# Patient Record
Sex: Male | Born: 1965 | Race: White | Hispanic: No | Marital: Married | Smoking: Never smoker
Health system: Southern US, Community
[De-identification: ages and names within clinical notes are randomized; demographics above are authoritative.]

## PROBLEM LIST (undated history)

## (undated) DIAGNOSIS — Z87442 Personal history of urinary calculi: Secondary | ICD-10-CM

## (undated) DIAGNOSIS — Z9109 Other allergy status, other than to drugs and biological substances: Secondary | ICD-10-CM

## (undated) DIAGNOSIS — F411 Generalized anxiety disorder: Secondary | ICD-10-CM

## (undated) DIAGNOSIS — Z973 Presence of spectacles and contact lenses: Secondary | ICD-10-CM

## (undated) DIAGNOSIS — M1990 Unspecified osteoarthritis, unspecified site: Secondary | ICD-10-CM

## (undated) HISTORY — PX: WISDOM TOOTH EXTRACTION: SHX21

## (undated) HISTORY — DX: Other allergy status, other than to drugs and biological substances: Z91.09

## (undated) HISTORY — DX: Generalized anxiety disorder: F41.1

## (undated) HISTORY — DX: Personal history of urinary calculi: Z87.442

---

## 2004-05-14 ENCOUNTER — Ambulatory Visit: Payer: Self-pay | Admitting: Internal Medicine

## 2004-09-28 ENCOUNTER — Ambulatory Visit: Payer: Self-pay | Admitting: Internal Medicine

## 2004-09-30 ENCOUNTER — Ambulatory Visit: Payer: Self-pay | Admitting: Family Medicine

## 2005-04-14 ENCOUNTER — Ambulatory Visit: Payer: Self-pay | Admitting: Internal Medicine

## 2005-05-26 ENCOUNTER — Ambulatory Visit: Payer: Self-pay | Admitting: Family Medicine

## 2005-07-27 ENCOUNTER — Ambulatory Visit: Payer: Self-pay | Admitting: Family Medicine

## 2006-05-17 ENCOUNTER — Ambulatory Visit: Payer: Self-pay | Admitting: Family Medicine

## 2006-11-22 ENCOUNTER — Ambulatory Visit: Payer: Self-pay | Admitting: Internal Medicine

## 2006-11-22 DIAGNOSIS — J019 Acute sinusitis, unspecified: Secondary | ICD-10-CM

## 2006-11-22 DIAGNOSIS — J309 Allergic rhinitis, unspecified: Secondary | ICD-10-CM | POA: Insufficient documentation

## 2006-11-22 DIAGNOSIS — Z87442 Personal history of urinary calculi: Secondary | ICD-10-CM

## 2008-04-15 ENCOUNTER — Ambulatory Visit: Payer: Self-pay | Admitting: Family Medicine

## 2009-11-10 ENCOUNTER — Ambulatory Visit: Payer: Self-pay | Admitting: Family Medicine

## 2009-11-10 DIAGNOSIS — L255 Unspecified contact dermatitis due to plants, except food: Secondary | ICD-10-CM

## 2010-01-29 ENCOUNTER — Encounter (INDEPENDENT_AMBULATORY_CARE_PROVIDER_SITE_OTHER): Payer: Self-pay | Admitting: *Deleted

## 2010-07-28 NOTE — Letter (Signed)
Summary: [name] letter  [facility]  [address] [place], [place] [address]   Phone: [phone]  Fax: [phone]       01/29/2010 MRN: [id number]  Peter Sanford [address] [place], [place]  [address]  Dear Mr. [facility] [place] and [place] announce the retirement of Arta Silence, M.D., from full-time practice at the [facility] office effective December 25, 2009 and his plans of returning part-time.  It is important to Dr. Hetty Ely and to our practice that you understand that [facility] has seven physicians in our office for your health care needs.  We will continue to offer the same exceptional care that you have today.    Dr. Hetty Ely has spoken to many of you about his plans for retirement and returning part-time in the fall.   We will continue to work with you through the transition to schedule appointments for you in the office and meet the high standards that [facility] is committed to.   Again, it is with great pleasure that we share the news that Dr. Hetty Ely will return to [facility] in October of 2011 with a reduced schedule.    If you have any questions, or would like to request an appointment with one of our physicians, please call us at [phone] and press the option for Scheduling an appointment.  We take pleasure in providing you with excellent patient care and look forward to seeing you at your next office visit.  Our [facility] Physicians are:  Tillman Abide, M.D. Laurita Quint, M.D. Roxy Manns, M.D. Kerby Nora, M.D. Hannah Beat, M.D. Ruthe Mannan, M.D. We proudly welcomed Raechel Ache, M.D. and Eustaquio Boyden, M.D. to the practice in July/August 2011.  Sincerely,  [facility]

## 2010-07-28 NOTE — Assessment & Plan Note (Signed)
Summary: Peter Sanford  CYD   Vital Signs:  Patient profile:   45 year old male Height:      68 inches Weight:      172 pounds BMI:     26.25 Temp:     98.6 degrees F oral Pulse rate:   68 / minute Pulse rhythm:   regular BP sitting:   110 / 70  (left arm) Cuff size:   regular  Vitals Entered By: Sydell Axon LPN (Nov 10, 2009 12:37 PM) CC: Peter ivy for a week   History of Present Illness: Pt here for Peter ivy for the past 6-7 days that has gotten progressively worse. It has started on the legs, lower posterior left then anterior and then bilat, now on the arms as well and red and now swelling. It has been weeping for a few days as well. He has had it before but typically not this bad. He has been applying topical OTC products.  Problems Prior to Update: 1)  Sinusitis, Acute  (ICD-461.9) 2)  Nephrolithiasis, Hx of  (ICD-V13.01) 3)  Allergic Rhinitis  (ICD-477.9)  Medications Prior to Update: 1)  Claritin 10 Mg Tabs (Loratadine) .Marland Kitchen.. 1 Daily As Needed 2)  Amoxicillin 500 Mg Caps (Amoxicillin) .... 2 Tabs By Mouth Bid  Allergies: No Known Drug Allergies  Physical Exam  General:  Well-developed,well-nourished,in no acute distress; alert,appropriate and cooperative throughout examination, comfortable and nonacute. Head:  Normocephalic and atraumatic without obvious abnormalities. No apparent alopecia but early  balding. Sinuses nontender. Eyes:  Conjunctiva clear bilaterally. No involvement of the eyes.  Skin:  Vessicular erythem based coalescent rash in some areas with weeping, esp behind the knees and post calf, more linear on the right LE, arms and erythem faint coalescent rash behind the left ear.   Impression & Recommendations:  Problem # 1:  RHUS DERMATITIS (ICD-692.6) Assessment New Take Prednisone and apply Clobetasol. Call if continues. His updated medication list for this problem includes:    Claritin 10 Mg Tabs (Loratadine) .Marland Kitchen... 1 daily as needed  Prednisone 50 Mg Tabs (Prednisone) ..... One tab by mouth in am    Clobetasol Propionate 0.05 % Crea (Clobetasol propionate) .Marland Kitchen... Apply to area two times a day  Complete Medication List: 1)  Claritin 10 Mg Tabs (Loratadine) .Marland Kitchen.. 1 daily as needed 2)  Prednisone 50 Mg Tabs (Prednisone) .... One tab by mouth in am 3)  Clobetasol Propionate 0.05 % Crea (Clobetasol propionate) .... Apply to area two times a day  Patient Instructions: 1)  RTC as needed. Prescriptions: CLOBETASOL PROPIONATE 0.05 % CREA (CLOBETASOL PROPIONATE) apply to area two times a day  #60  gms x 0   Entered and Authorized by:   Shaune Leeks MD   Signed by:   Shaune Leeks MD on 11/10/2009   Method used:   Electronically to        [facility] (retail)       [address]       [place], [place]  [address]       Ph: [phone]       Fax: [phone]   RxID:   [id number] PREDNISONE 50 MG TABS (PREDNISONE) one tab by mouth in AM  #6 x 0   Entered and Authorized by:   Shaune Leeks MD   Signed by:   Shaune Leeks MD on 11/10/2009   Method used:   Electronically to  [facility]       [address]       [place], [place]  [address]       Ph: [phone]       Fax: [phone]   RxID:   [id number]   Current Allergies (reviewed today): No known allergies

## 2011-02-02 ENCOUNTER — Ambulatory Visit (INDEPENDENT_AMBULATORY_CARE_PROVIDER_SITE_OTHER): Payer: BC Managed Care – PPO | Admitting: Family Medicine

## 2011-02-02 ENCOUNTER — Encounter: Payer: Self-pay | Admitting: Family Medicine

## 2011-02-02 VITALS — BP 130/80 | HR 73 | Temp 98.2°F | Ht 69.0 in | Wt 166.0 lb

## 2011-02-02 DIAGNOSIS — F411 Generalized anxiety disorder: Secondary | ICD-10-CM

## 2011-02-02 MED ORDER — DIAZEPAM 5 MG PO TABS: 5.0000 mg | ORAL_TABLET | Freq: Three times a day (TID) | ORAL | Status: DC | PRN

## 2011-02-02 MED ORDER — CITALOPRAM HYDROBROMIDE 20 MG PO TABS: 20.0000 mg | ORAL_TABLET | Freq: Every day | ORAL | Status: DC

## 2011-02-02 NOTE — Patient Instructions (Signed)
F/u 4-6 weeks

## 2011-02-02 NOTE — Progress Notes (Signed)
  Subjective:    Patient ID: Peter Sanford, male    DOB: 02-18-1966, 45 y.o.   MRN: [id number]  HPI  Head does not really feel right. Having a lot of nervousness. A great deal of stress at work. About two weeks ago it would happen, triggered a cyclical thought process. Has been going on and consuming for about two. Feels like it has been ongoing for a couple of weeks. Had a similar to a fwar response.   Getting overly worried about things. Not sleeping well. Some stress with finances and money.   No substance.  Jumps rope 20 minutes a day  The PMH, PSH, Social History, Family History, Medications, and allergies have been reviewed in [facility], and have been updated if relevant.  Review of Systems ROS above, no fever, chills, sweats or systemic sx    Objective:   Physical Exam   Physical Exam  Blood pressure 130/80, pulse 73, temperature 98.2 F (36.8 C), temperature source Oral, height 5\' 9"  (1.753 m), weight 166 lb (75.297 kg), SpO2 98.00%.  GEN: WDWN, NAD, Non-toxic, A & O x 3 HEENT: Atraumatic, Normocephalic. Neck supple. No masses, No LAD. Ears and Nose: No external deformity. EXTR: No c/c/e NEURO Normal gait.  PSYCH: Normally interactive. Conversant. Not depressed or anxious appearing.  Calm demeanor.        Assessment & Plan:   1. Generalized anxiety disorder  citalopram (CELEXA) 20 MG tablet, diazepam (VALIUM) 5 MG tablet   Anxiety, start meds, working out already Benadryl prn for sleep Valium prn if needed for now, short term

## 2011-02-04 ENCOUNTER — Telehealth: Payer: Self-pay | Admitting: *Deleted

## 2011-02-04 MED ORDER — ZOLPIDEM TARTRATE 10 MG PO TABS: 10.0000 mg | ORAL_TABLET | Freq: Every evening | ORAL | Status: DC | PRN

## 2011-02-04 NOTE — Telephone Encounter (Signed)
[facility] and medication fixed in epic

## 2011-02-04 NOTE — Telephone Encounter (Signed)
Pt is asking for something to help him sleep.   Says he discussed this with you at his visit.   He says the valium you gave him isn't working.  [facility]

## 2011-02-04 NOTE — Telephone Encounter (Signed)
Please call in   Ambien 10 mg, 1 po qhs prn sleep. #30, 0 refills

## 2011-02-05 ENCOUNTER — Telehealth: Payer: Self-pay | Admitting: *Deleted

## 2011-02-05 ENCOUNTER — Encounter: Payer: Self-pay | Admitting: Family Medicine

## 2011-02-05 NOTE — Telephone Encounter (Signed)
Rx called to pharmacy and patient advised  

## 2011-02-05 NOTE — Telephone Encounter (Signed)
He can try Lunesta. There is literally going to be nothing else aside from this and all the other things I have mentioned.  Call in Lunesta 3 mg. 1 tab po 10 minutes before bed. #30, 0 refills.  I also would suggest getting over the counter Melatonin. Take 3-5 mg 1 hour before sleep every night. This is very safe and can be done indefinitely.

## 2011-02-05 NOTE — Telephone Encounter (Signed)
Patient said that he has tried the benadryl and it didn't work for patient.

## 2011-02-05 NOTE — Telephone Encounter (Signed)
No, try Benadryl 2 tablets (50 mg) before bed.  Or Unisom (doxylamine) - 1 tablet before bed.  Not both Take about 30 mins to an hour before sleep.  Make sure he understands name and instructions.

## 2011-02-05 NOTE — Telephone Encounter (Signed)
Pt states he tried [place] last night and it didn't work, says he only slept about 3 hours, woke up and he was wide awake.  He's asking what he can try next, ambien CR?  [facility] [place].

## 2011-02-08 ENCOUNTER — Telehealth: Payer: Self-pay | Admitting: *Deleted

## 2011-02-08 NOTE — Telephone Encounter (Signed)
Form faxed to [facility].

## 2011-02-08 NOTE — Telephone Encounter (Signed)
Opened in error

## 2011-02-08 NOTE — Telephone Encounter (Signed)
done

## 2011-02-08 NOTE — Telephone Encounter (Signed)
Prior auth is needed for lunesta, form is on your desk. 

## 2011-02-09 NOTE — Telephone Encounter (Signed)
Prior auth given for [employer], advised pharmacy, approval letter placed on doctor's desk for signature and scanning.

## 2011-04-28 ENCOUNTER — Other Ambulatory Visit: Payer: Self-pay | Admitting: Family Medicine

## 2011-08-19 ENCOUNTER — Other Ambulatory Visit: Payer: Self-pay | Admitting: Family Medicine

## 2011-10-18 ENCOUNTER — Other Ambulatory Visit: Payer: Self-pay | Admitting: Family Medicine

## 2011-11-17 ENCOUNTER — Other Ambulatory Visit: Payer: Self-pay | Admitting: Family Medicine

## 2012-02-22 ENCOUNTER — Other Ambulatory Visit: Payer: Self-pay | Admitting: Family Medicine

## 2012-02-22 NOTE — Telephone Encounter (Signed)
Received refill request electronically. Last office visit 02/02/11. Is it okay to refill medication?

## 2012-02-23 ENCOUNTER — Telehealth: Payer: Self-pay | Admitting: *Deleted

## 2012-02-23 NOTE — Telephone Encounter (Signed)
Pt has scheduled physical for 9/19.  Do you want him to have lab work prior?

## 2012-02-23 NOTE — Telephone Encounter (Signed)
Ok  To refill 30, 1 ov, please set up Ov CPX

## 2012-02-23 NOTE — Telephone Encounter (Signed)
Yes - set up appt for lab

## 2012-02-23 NOTE — Telephone Encounter (Signed)
Medicine sent to pharmacy.  Spoke with patient and advised that he needs physical before further refills.  We were in the process of finding an available time when his phone cut off, I called him back but had to leave a message asking that he call back to schedule.

## 2012-02-24 NOTE — Telephone Encounter (Signed)
Pt declined lab appt at this time.  He will discuss this when he comes in for his physical.

## 2012-03-16 ENCOUNTER — Encounter: Payer: Self-pay | Admitting: Family Medicine

## 2012-03-16 ENCOUNTER — Ambulatory Visit (INDEPENDENT_AMBULATORY_CARE_PROVIDER_SITE_OTHER): Payer: BC Managed Care – PPO | Admitting: Family Medicine

## 2012-03-16 VITALS — BP 135/82 | HR 62 | Temp 98.9°F | Ht 69.0 in | Wt 187.0 lb

## 2012-03-16 DIAGNOSIS — Z23 Encounter for immunization: Secondary | ICD-10-CM

## 2012-03-16 DIAGNOSIS — Z0000 Encounter for general adult medical examination without abnormal findings: Secondary | ICD-10-CM

## 2012-03-16 DIAGNOSIS — Z131 Encounter for screening for diabetes mellitus: Secondary | ICD-10-CM

## 2012-03-16 DIAGNOSIS — R5381 Other malaise: Secondary | ICD-10-CM

## 2012-03-16 DIAGNOSIS — Z13220 Encounter for screening for lipoid disorders: Secondary | ICD-10-CM

## 2012-03-16 DIAGNOSIS — L570 Actinic keratosis: Secondary | ICD-10-CM

## 2012-03-16 NOTE — Progress Notes (Signed)
[facility] conservation officer at [facility] [address] [place] [address] Phone: [phone] Fax: [phone]  Date:  03/16/2012   Name:  Peter Sanford   DOB:  August 13, 1965   MRN:  [id number] Gender: male Age: 46 y.o.  PCP:  Hannah Beat, MD    Chief Complaint: Annual Exam   History of Present Illness:  Peter Sanford is a 46 y.o. pleasant patient who presents with the following:  Running 20 miles a week Mood has levelled  Some stress at home and work.  Healthy male for CPX  Preventative Health Maintenance Visit:  Health Maintenance Summary Reviewed and updated, unless pt declines services.  Tobacco History Reviewed. Alcohol: No concerns, no excessive use Exercise Habits: running STD concerns: no risk or activity to increase risk Drug Use: None Encouraged self-testicular check  Health Maintenance  Topic Date Due  . Tetanus/tdap  07/09/1985  . Influenza Vaccine  02/27/2012     Wt Readings from Last 3 Encounters:  03/16/12 187 lb (84.823 kg)  02/02/11 166 lb (75.297 kg)  11/10/09 172 lb (78.019 kg)     Patient Active Problem List  Diagnosis  . ALLERGIC RHINITIS  . RHUS DERMATITIS  . NEPHROLITHIASIS, HX OF  . Generalized anxiety disorder    Past Medical History  Diagnosis Date  . Pollen allergies   . History of nephrolithiasis     No past surgical history on file.  History  Substance Use Topics  . Smoking status: Never Smoker   . Smokeless tobacco: Not on file  . Alcohol Use: Yes    No family history on file.  No Known Allergies  Medication list has been reviewed and updated.  Outpatient Prescriptions Prior to Visit  Medication Sig Dispense Refill  . citalopram (CELEXA) 20 MG tablet TAKE ONE TABLET BY MOUTH ONE TIME DAILY  30 tablet  1  . Eszopiclone (ESZOPICLONE) 3 MG TABS Take 3 mg by mouth at bedtime. Take immediately before bedtime       . Loratadine (CLARITIN) 10 MG CAPS Take 1 capsule by mouth daily as needed.           Review of Systems:   General: Denies fever, chills, sweats. No significant weight loss. Eyes: Denies blurring,significant itching ENT: Denies earache, sore throat, and hoarseness. Cardiovascular: Denies chest pains, palpitations, dyspnea on exertion Respiratory: Denies cough, dyspnea at rest,wheeezing Breast: no concerns about lumps GI: Denies nausea, vomiting, diarrhea, constipation, change in bowel habits, abdominal pain, melena, hematochezia GU: Denies penile discharge, ED, urinary flow / outflow problems. No STD concerns. Musculoskeletal: Denies back pain, joint pain Derm: 2 PLACES ON HEAD, 1 PLACE ON FOREARM, ONE PLACE ON L KNEE  Neuro: Denies  paresthesias, frequent falls, frequent headaches Psych: Denies depression, anxiety Endocrine: Denies cold intolerance, heat intolerance, polydipsia Heme: Denies enlarged lymph nodes Allergy: No hayfever  Physical Examination: Filed Vitals:   03/16/12 1448  BP: 135/82  Pulse: 62  Temp: 98.9 F (37.2 C)   Filed Vitals:   03/16/12 1448  Height: 5\' 9"  (1.753 m)  Weight: 187 lb (84.823 kg)   Body mass index is 27.62 kg/(m^2). Ideal Body Weight: Weight in (lb) to have BMI = 25: 168.9   GEN: well developed, well nourished, no acute distress Eyes: conjunctiva and lids normal, PERRLA, EOMI ENT: TM clear, nares clear, oral exam WNL Neck: supple, no lymphadenopathy, no thyromegaly, no JVD Pulm: clear to auscultation and percussion, respiratory effort normal CV: regular rate and rhythm, S1-S2, no murmur,  rub or gallop, no bruits, peripheral pulses normal and symmetric, no cyanosis, clubbing, edema or varicosities GI: soft, non-tender; no hepatosplenomegaly, masses; active bowel sounds all quadrants GU: no hernia, testicular mass, penile discharge Lymph: no cervical, axillary or inguinal adenopathy MSK: gait normal, muscle tone and strength WNL, no joint swelling, effusions, discoloration, crepitus  SKIN: small scaly lesions,  forehead x 2, l forearm distal to LE, and L medial knee. Neuro: normal mental status, normal strength, sensation, and motion Psych: alert; oriented to person, place and time, normally interactive and not anxious or depressed in appearance.  Assessment and Plan:  1. Routine general medical examination at a health care facility    2. Need for prophylactic vaccination and inoculation against influenza    3. Screening for lipoid disorders  LDL cholesterol, direct  4. Screening for diabetes mellitus  Basic metabolic panel  5. Other malaise and fatigue  CBC with Differential, Hepatic function panel  6. AK (actinic keratosis)     The patient's preventative maintenance and recommended screening tests for an annual wellness exam were reviewed in full today. Brought up to date unless services declined.  Counselled on the importance of diet, exercise, and its role in overall health and mortality. The patient's FH and SH was reviewed, including their home life, tobacco status, and drug and alcohol status.   Cryotherapy  Reason: concern for AK, precancer Location: 2 on scalp, 1 on forearm (left), and 1 on L medial knee  Liquid nitrogen was applied using the liquid nitrogen gun without difficulty with an otoscope tip for concentration. Tolerated well without complications.   Orders Today:  Orders Placed This Encounter  Procedures  . Basic metabolic panel  . CBC with Differential  . Hepatic function panel  . LDL cholesterol, direct    Updated Medication List: (Includes new medications, updates to list, dose adjustments) Outpatient Encounter Prescriptions as of 03/16/2012  Medication Sig Dispense Refill  . citalopram (CELEXA) 20 MG tablet TAKE ONE TABLET BY MOUTH ONE TIME DAILY  30 tablet  1  . Eszopiclone (ESZOPICLONE) 3 MG TABS Take 3 mg by mouth at bedtime. Take immediately before bedtime       . Loratadine (CLARITIN) 10 MG CAPS Take 1 capsule by mouth daily as needed.           Medications Discontinued: There are no discontinued medications.   Hannah Beat, MD

## 2012-03-17 LAB — CBC WITH DIFFERENTIAL/PLATELET
Basophils Relative: 1.5 % (ref 0.0–3.0)
Eosinophils Relative: 6.1 % — ABNORMAL HIGH (ref 0.0–5.0)
HCT: 47.1 % (ref 39.0–52.0)
Hemoglobin: 15.6 g/dL (ref 13.0–17.0)
Lymphs Abs: 1.8 10*3/uL (ref 0.7–4.0)
Monocytes Relative: 9.8 % (ref 3.0–12.0)
Neutro Abs: 2.7 10*3/uL (ref 1.4–7.7)
Platelets: 256 10*3/uL (ref 150.0–400.0)
RBC: 5.01 Mil/uL (ref 4.22–5.81)
WBC: 5.4 10*3/uL (ref 4.5–10.5)

## 2012-03-17 LAB — BASIC METABOLIC PANEL
Chloride: 103 mEq/L (ref 96–112)
GFR: 80.93 mL/min (ref >60.00)
Potassium: 4.2 mEq/L (ref 3.5–5.1)
Sodium: 138 mEq/L (ref 135–145)

## 2012-03-17 LAB — LDL CHOLESTEROL, DIRECT: Direct LDL: 164.2 mg/dL

## 2012-03-17 LAB — HEPATIC FUNCTION PANEL
ALT: 23 U/L (ref 0–53)
AST: 22 U/L (ref 0–37)
Total Bilirubin: 0.9 mg/dL (ref 0.3–1.2)
Total Protein: 7.4 g/dL (ref 6.0–8.3)

## 2012-04-19 ENCOUNTER — Other Ambulatory Visit: Payer: Self-pay | Admitting: Family Medicine

## 2012-10-12 ENCOUNTER — Other Ambulatory Visit: Payer: Self-pay | Admitting: Family Medicine

## 2012-11-23 ENCOUNTER — Other Ambulatory Visit: Payer: Self-pay | Admitting: Family Medicine

## 2012-12-24 ENCOUNTER — Other Ambulatory Visit: Payer: Self-pay | Admitting: Family Medicine

## 2013-01-21 ENCOUNTER — Other Ambulatory Visit: Payer: Self-pay | Admitting: Family Medicine

## 2013-03-21 ENCOUNTER — Other Ambulatory Visit: Payer: Self-pay | Admitting: Family Medicine

## 2013-03-21 NOTE — Telephone Encounter (Signed)
Last office visit 03/16/2012.  Refill?

## 2013-03-22 ENCOUNTER — Other Ambulatory Visit: Payer: Self-pay | Admitting: Family Medicine

## 2013-03-22 NOTE — Telephone Encounter (Signed)
Ok to refill 30, 2 refills  F/u cpx

## 2013-03-22 NOTE — Telephone Encounter (Signed)
Last office visit 03/16/2012.  Ok to refill? 

## 2013-03-23 NOTE — Telephone Encounter (Signed)
Left message on cell phone that refills on his Celexa has been sent to his pharmacy but to call our office next week to schedule a CPE with a pre-physical lab appointment.

## 2013-06-05 ENCOUNTER — Other Ambulatory Visit (INDEPENDENT_AMBULATORY_CARE_PROVIDER_SITE_OTHER): Payer: BC Managed Care – PPO

## 2013-06-05 DIAGNOSIS — Z79899 Other long term (current) drug therapy: Secondary | ICD-10-CM

## 2013-06-05 DIAGNOSIS — Z13220 Encounter for screening for lipoid disorders: Secondary | ICD-10-CM

## 2013-06-05 DIAGNOSIS — R5381 Other malaise: Secondary | ICD-10-CM

## 2013-06-05 LAB — LIPID PANEL
Cholesterol: 221 mg/dL — ABNORMAL HIGH (ref 0–200)
HDL: 39.9 mg/dL (ref >39.00)
Triglycerides: 119 mg/dL (ref 0.0–149.0)
VLDL: 23.8 mg/dL (ref 0.0–40.0)

## 2013-06-05 LAB — CBC WITH DIFFERENTIAL/PLATELET
Basophils Absolute: 0 10*3/uL (ref 0.0–0.1)
Basophils Relative: 0.8 % (ref 0.0–3.0)
Eosinophils Relative: 5.3 % — ABNORMAL HIGH (ref 0.0–5.0)
Hemoglobin: 15.2 g/dL (ref 13.0–17.0)
Lymphocytes Relative: 37 % (ref 12.0–46.0)
Monocytes Absolute: 0.3 10*3/uL (ref 0.1–1.0)
Monocytes Relative: 6 % (ref 3.0–12.0)
Neutro Abs: 2.4 10*3/uL (ref 1.4–7.7)
Neutrophils Relative %: 50.9 % (ref 43.0–77.0)
RBC: 4.84 Mil/uL (ref 4.22–5.81)
RDW: 13.2 % (ref 11.5–14.6)
WBC: 4.6 10*3/uL (ref 4.5–10.5)

## 2013-06-05 LAB — HEPATIC FUNCTION PANEL
Albumin: 4.1 g/dL (ref 3.5–5.2)
Alkaline Phosphatase: 42 U/L (ref 39–117)
Bilirubin, Direct: 0 mg/dL (ref 0.0–0.3)
Total Protein: 7.3 g/dL (ref 6.0–8.3)

## 2013-06-05 LAB — BASIC METABOLIC PANEL
CO2: 24 mEq/L (ref 19–32)
Chloride: 101 mEq/L (ref 96–112)
Creatinine, Ser: 1.3 mg/dL (ref 0.4–1.5)
GFR: 65.23 mL/min (ref >60.00)
Glucose, Bld: 165 mg/dL — ABNORMAL HIGH (ref 70–99)

## 2013-06-06 ENCOUNTER — Ambulatory Visit: Payer: BC Managed Care – PPO

## 2013-06-06 DIAGNOSIS — R7309 Other abnormal glucose: Secondary | ICD-10-CM

## 2013-06-06 LAB — HEMOGLOBIN A1C: Hgb A1c MFr Bld: 6 % (ref 4.6–6.5)

## 2013-06-11 ENCOUNTER — Encounter: Payer: Self-pay | Admitting: Family Medicine

## 2013-06-11 ENCOUNTER — Ambulatory Visit (INDEPENDENT_AMBULATORY_CARE_PROVIDER_SITE_OTHER): Payer: BC Managed Care – PPO | Admitting: Family Medicine

## 2013-06-11 VITALS — BP 114/80 | HR 58 | Temp 98.1°F | Ht 68.0 in | Wt 178.5 lb

## 2013-06-11 DIAGNOSIS — Z23 Encounter for immunization: Secondary | ICD-10-CM

## 2013-06-11 DIAGNOSIS — Z0000 Encounter for general adult medical examination without abnormal findings: Secondary | ICD-10-CM

## 2013-06-11 NOTE — Progress Notes (Signed)
Date:  06/11/2013   Name:  Peter Sanford   DOB:  04/05/1966   MRN:  [id number] Gender: male Age: 47 y.o.  Primary Physician:  Hannah Beat, MD   Chief Complaint: Annual Exam   Subjective:   History of Present Illness:  Peter Sanford is a 47 y.o. pleasant patient who presents with the following:  Preventative Health Maintenance Visit:  Health Maintenance Summary Reviewed and updated, unless pt declines services.  Tobacco History Reviewed. Alcohol: No concerns, no excessive use Exercise Habits: Some activity, rec at least 30 mins 5 times a week STD concerns: no risk or activity to increase risk Drug Use: None Encouraged self-testicular check  Health Maintenance  Topic Date Due  . Influenza Vaccine  01/26/2014  . Tetanus/tdap  06/12/2023    Immunization History  Administered Date(s) Administered  . Influenza Split 03/16/2012  . Tdap 06/11/2013    Patient Active Problem List   Diagnosis Date Noted  . Generalized anxiety disorder 02/02/2011  . 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  . ALLERGIC RHINITIS 11/22/2006  . NEPHROLITHIASIS, HX OF 11/22/2006    Past Medical History  Diagnosis Date  . Pollen allergies   . History of nephrolithiasis     No past surgical history on file.  History   Social History  . Marital Status: Married    Spouse Name: N/A    Number of Children: N/A  . Years of Education: N/A   Occupational History  . Not on file.   Social History Main Topics  . Smoking status: Never Smoker   . Smokeless tobacco: Never Used  . Alcohol Use: Yes  . Drug Use: No  . Sexual Activity: Not on file   Other Topics Concern  . Not on file   Social History Narrative  . No narrative on file    No family history on file.  No Known Allergies  Medication list has been reviewed and updated.  Review of Systems:  General: Denies fever, chills, sweats. No significant weight loss. Eyes: Denies blurring,significant itching ENT: Denies  earache, sore throat, and hoarseness. Cardiovascular: Denies chest pains, palpitations, dyspnea on exertion Respiratory: Denies cough, dyspnea at rest,wheeezing Breast: no concerns about lumps GI: Denies nausea, vomiting, diarrhea, constipation, change in bowel habits, abdominal pain, melena, hematochezia GU: Denies penile discharge, ED, urinary flow / outflow problems. No STD concerns. Musculoskeletal: Denies back pain, joint pain Derm: Denies rash, itching Neuro: Denies  paresthesias, frequent falls, frequent headaches Psych: Denies depression, anxiety Endocrine: Denies cold intolerance, heat intolerance, polydipsia Heme: Denies enlarged lymph nodes Allergy: No hayfever  Objective:   Physical Examination: BP 114/80  Pulse 58  Temp(Src) 98.1 F (36.7 C) (Oral)  Ht 5\' 8"  (1.727 m)  Wt 178 lb 8 oz (80.967 kg)  BMI 27.15 kg/m2 Ideal Body Weight: Weight in (lb) to have BMI = 25: 164.1  GEN: well developed, well nourished, no acute distress Eyes: conjunctiva and lids normal, PERRLA, EOMI ENT: TM clear, nares clear, oral exam WNL Neck: supple, no lymphadenopathy, no thyromegaly, no JVD Pulm: clear to auscultation and percussion, respiratory effort normal CV: regular rate and rhythm, S1-S2, no murmur, rub or gallop, no bruits, peripheral pulses normal and symmetric, no cyanosis, clubbing, edema or varicosities GI: soft, non-tender; no hepatosplenomegaly, masses; active bowel sounds all quadrants GU: no hernia, testicular mass, penile discharge Lymph: no cervical, axillary or inguinal adenopathy MSK: gait normal, muscle tone and strength WNL, no joint swelling, effusions, discoloration, crepitus  SKIN: clear, good turgor, color WNL,  no rashes, lesions, or ulcerations Neuro: normal mental status, normal strength, sensation, and motion Psych: alert; oriented to person, place and time, normally interactive and not anxious or depressed in appearance.  All labs reviewed with  patient.  Lipids:    Component Value Date/Time   CHOL 221* 06/05/2013 1102   TRIG 119.0 06/05/2013 1102   HDL 39.90 06/05/2013 1102   LDLDIRECT 168.9 06/05/2013 1102   VLDL 23.8 06/05/2013 1102   CHOLHDL 6 06/05/2013 1102    CBC:    Component Value Date/Time   WBC 4.6 06/05/2013 1102   HGB 15.2 06/05/2013 1102   HCT 44.8 06/05/2013 1102   PLT 244.0 06/05/2013 1102   MCV 92.6 06/05/2013 1102   NEUTROABS 2.4 06/05/2013 1102   LYMPHSABS 1.7 06/05/2013 1102   MONOABS 0.3 06/05/2013 1102   EOSABS 0.2 06/05/2013 1102   BASOSABS 0.0 06/05/2013 1102    Basic Metabolic Panel:    Component Value Date/Time   NA 133* 06/05/2013 1102   K 4.0 06/05/2013 1102   CL 101 06/05/2013 1102   CO2 24 06/05/2013 1102   BUN 16 06/05/2013 1102   CREATININE 1.3 06/05/2013 1102   GLUCOSE 165* 06/05/2013 1102   CALCIUM 8.9 06/05/2013 1102    Lab Results  Component Value Date   ALT 23 06/05/2013   AST 23 06/05/2013   ALKPHOS 42 06/05/2013   BILITOT 0.8 06/05/2013   Lab Results  Component Value Date   HGBA1C 6.0 06/06/2013     Assessment & Plan:   Health Maintenance Exam: The patient's preventative maintenance and recommended screening tests for an annual wellness exam were reviewed in full today. Brought up to date unless services declined.  Counselled on the importance of diet, exercise, and its role in overall health and mortality. The patient's FH and SH was reviewed, including their home life, tobacco status, and drug and alcohol status.  Routine general medical examination at a health care facility  Need for prophylactic vaccination with combined diphtheria-tetanus-pertussis (DTP) vaccine - Plan: Tdap vaccine greater than or equal to 7yo IM   Doing well. Was not fasting and blood draw and BS at 160, but a1c is 6  Orders Today:  Orders Placed This Encounter  Procedures  . Tdap vaccine greater than or equal to 7yo IM    New medications, updates to list, dose adjustments: No orders of the defined  types were placed in this encounter.    Signed,  Elpidio Galea. Quirino Kakos, MD, CAQ Sports Medicine  [facility] [address] [place] [address] Phone: [phone] Fax: [phone]  Updated Complete Medication List:   Medication List       This list is accurate as of: 06/11/13  5:01 PM.  Always use your most recent med list.               citalopram 20 MG tablet  Commonly known as:  CELEXA  TAKE ONE TABLET BY MOUTH ONE TIME DAILY     CLARITIN 10 MG Caps  Generic drug:  Loratadine  Take 1 capsule by mouth daily as needed.

## 2013-06-11 NOTE — Progress Notes (Signed)
Pre-visit discussion using our clinic review tool. No additional management support is needed unless otherwise documented below in the visit note.  

## 2013-07-18 ENCOUNTER — Other Ambulatory Visit: Payer: Self-pay | Admitting: Family Medicine

## 2013-09-14 ENCOUNTER — Other Ambulatory Visit: Payer: Self-pay | Admitting: Family Medicine

## 2014-09-18 ENCOUNTER — Other Ambulatory Visit: Payer: Self-pay | Admitting: Family Medicine

## 2014-09-23 ENCOUNTER — Other Ambulatory Visit: Payer: Self-pay | Admitting: Family Medicine

## 2014-09-23 NOTE — Telephone Encounter (Signed)
Please schedule CPE with fasting lab prior for Dr. Lorelei Pont.

## 2014-09-23 NOTE — Telephone Encounter (Signed)
Last office visit 06/11/2013.  No future appointments scheduled.  Refill?

## 2014-09-23 NOTE — Telephone Encounter (Signed)
Ok to refill 90 days and f/u cpx

## 2014-09-24 NOTE — Telephone Encounter (Signed)
Left message asking pt to call office  °

## 2014-12-17 ENCOUNTER — Encounter: Payer: Self-pay | Admitting: Emergency Medicine

## 2014-12-17 ENCOUNTER — Emergency Department
Admission: EM | Admit: 2014-12-17 | Discharge: 2014-12-18 | Disposition: A | Discharge status: Home / Self Care | Payer: BC Managed Care – PPO | Attending: Emergency Medicine | Admitting: Emergency Medicine

## 2014-12-17 DIAGNOSIS — Z79899 Other long term (current) drug therapy: Secondary | ICD-10-CM | POA: Insufficient documentation

## 2014-12-17 DIAGNOSIS — R509 Fever, unspecified: Secondary | ICD-10-CM | POA: Diagnosis not present

## 2014-12-17 DIAGNOSIS — R109 Unspecified abdominal pain: Secondary | ICD-10-CM | POA: Diagnosis present

## 2014-12-17 LAB — URINALYSIS COMPLETE WITH MICROSCOPIC (ARMC ONLY)
BILIRUBIN URINE: NEGATIVE
Bacteria, UA: NONE SEEN
Glucose, UA: NEGATIVE mg/dL
HGB URINE DIPSTICK: NEGATIVE
Ketones, ur: NEGATIVE mg/dL
LEUKOCYTES UA: NEGATIVE
Nitrite: NEGATIVE
Protein, ur: NEGATIVE mg/dL
Specific Gravity, Urine: 1.015 (ref 1.005–1.030)
Squamous Epithelial / LPF: NONE SEEN
pH: 6 (ref 5.0–8.0)

## 2014-12-17 LAB — COMPREHENSIVE METABOLIC PANEL
ALT: 25 U/L (ref 17–63)
AST: 32 U/L (ref 15–41)
Albumin: 4.1 g/dL (ref 3.5–5.0)
Alkaline Phosphatase: 50 U/L (ref 38–126)
Anion gap: 10 (ref 5–15)
BUN: 18 mg/dL (ref 6–20)
CALCIUM: 8.8 mg/dL — AB (ref 8.9–10.3)
CO2: 26 mmol/L (ref 22–32)
Chloride: 102 mmol/L (ref 101–111)
Creatinine, Ser: 1.13 mg/dL (ref 0.61–1.24)
GFR calc Af Amer: >60 mL/min (ref >60)
GFR calc non Af Amer: >60 mL/min (ref >60)
Glucose, Bld: 147 mg/dL — ABNORMAL HIGH (ref 65–99)
POTASSIUM: 4 mmol/L (ref 3.5–5.1)
SODIUM: 138 mmol/L (ref 135–145)
Total Bilirubin: 1.1 mg/dL (ref 0.3–1.2)
Total Protein: 7.3 g/dL (ref 6.5–8.1)

## 2014-12-17 LAB — CBC WITH DIFFERENTIAL/PLATELET
Basophils Absolute: 0 10*3/uL (ref 0–0.1)
Basophils Relative: 0 %
Eosinophils Absolute: 0.1 10*3/uL (ref 0–0.7)
Eosinophils Relative: 2 %
HCT: 47.1 % (ref 40.0–52.0)
Hemoglobin: 15.6 g/dL (ref 13.0–18.0)
LYMPHS PCT: 7 %
Lymphs Abs: 0.4 10*3/uL — ABNORMAL LOW (ref 1.0–3.6)
MCH: 31.1 pg (ref 26.0–34.0)
MCHC: 33.1 g/dL (ref 32.0–36.0)
MCV: 93.9 fL (ref 80.0–100.0)
MONO ABS: 0.3 10*3/uL (ref 0.2–1.0)
Monocytes Relative: 5 %
NEUTROS ABS: 5.5 10*3/uL (ref 1.4–6.5)
NEUTROS PCT: 86 %
Platelets: 214 10*3/uL (ref 150–440)
RBC: 5.01 MIL/uL (ref 4.40–5.90)
RDW: 13.1 % (ref 11.5–14.5)
WBC: 6.3 10*3/uL (ref 3.8–10.6)

## 2014-12-17 NOTE — ED Notes (Signed)
Pt presents to ED with right sided lower abd "discomfort", nausea, and fever of 100.8. Denies vomiting or similar symptoms previously. pcp told pt to come to ED for further evaluation. Pt states his pain has radiated to just above his right hip. Denies any tenderness with palpation. Ambulatory with steady gait. No distress noted.

## 2014-12-18 ENCOUNTER — Emergency Department: Payer: BC Managed Care – PPO

## 2014-12-18 ENCOUNTER — Telehealth: Payer: Self-pay

## 2014-12-18 ENCOUNTER — Other Ambulatory Visit: Payer: Self-pay | Admitting: Family Medicine

## 2014-12-18 MED ORDER — IOHEXOL 300 MG/ML  SOLN
100.0000 mL | Freq: Once | INTRAMUSCULAR | Status: AC | PRN
  Administered 2014-12-18: 100 mL via INTRAVENOUS

## 2014-12-18 MED ORDER — IOHEXOL 240 MG/ML SOLN
25.0000 mL | Freq: Once | INTRAMUSCULAR | Status: AC | PRN
  Administered 2014-12-18: 25 mL via ORAL

## 2014-12-18 NOTE — ED Notes (Signed)
Patient presents to ED with complaint of RLQ abdominal pain and fever at home of 100.8. Patient reports took ibuprofen at home PTA. Patient denies nausea, vomiting, diarrhea, chest pain, or shortness of breath. Patient alert and oriented, calm, cooperative, family at bedside. Will continue to monitor.

## 2014-12-18 NOTE — Telephone Encounter (Signed)
Electronic refill request, last refilled on 09/23/14 #30 with 2 additional refills last OV was on 06/11/2013 and no future appt.

## 2014-12-18 NOTE — Telephone Encounter (Signed)
Pt was seen at [facility] ED on 12/17/14.

## 2014-12-18 NOTE — ED Notes (Signed)
Notified CT patient is finished drinking contrast.

## 2014-12-18 NOTE — Discharge Instructions (Signed)
Flank Pain °Flank pain refers to pain that is located on the side of the body between the upper abdomen and the back. The pain may occur over a short period of time (acute) or may be long-term or reoccurring (chronic). It may be mild or severe. Flank pain can be caused by many things. °CAUSES  °Some of the more common causes of flank pain include: °· Muscle strains.   °· Muscle spasms.   °· A disease of your spine (vertebral disk disease).   °· A lung infection (pneumonia).   °· Fluid around your lungs (pulmonary edema).   °· A kidney infection.   °· Kidney stones.   °· A very painful skin rash caused by the chickenpox virus (shingles).   °· Gallbladder disease.   °HOME CARE INSTRUCTIONS  °Home care will depend on the cause of your pain. In general, °· Rest as directed by your caregiver. °· Drink enough fluids to keep your urine clear or pale yellow. °· Only take over-the-counter or prescription medicines as directed by your caregiver. Some medicines may help relieve the pain. °· Tell your caregiver about any changes in your pain. °· Follow up with your caregiver as directed. °SEEK IMMEDIATE MEDICAL CARE IF:  °· Your pain is not controlled with medicine.   °· You have new or worsening symptoms. °· Your pain increases.   °· You have abdominal pain.   °· You have shortness of breath.   °· You have persistent nausea or vomiting.   °· You have swelling in your abdomen.   °· You feel faint or pass out.   °· You have blood in your urine. °· You have a fever or persistent symptoms for more than 2-3 days. °· You have a fever and your symptoms suddenly get worse. °MAKE SURE YOU:  °· Understand these instructions. °· Will watch your condition. °· Will get help right away if you are not doing well or get worse. °Document Released: 08/05/2005 Document Revised: 03/08/2012 Document Reviewed: 01/27/2012 °ExitCare® Patient Information ©2015 [employer]. This information is not intended to replace advice given to you by your  health care provider. Make sure you discuss any questions you have with your health care provider. ° °

## 2014-12-18 NOTE — Telephone Encounter (Signed)
PLEASE NOTE: All timestamps contained within this report are represented as [place] Standard Time. CONFIDENTIALTY NOTICE: This fax transmission is intended only for the addressee. It contains information that is legally privileged, confidential or otherwise protected from use or disclosure. If you are not the intended recipient, you are strictly prohibited from reviewing, disclosing, copying using or disseminating any of this information or taking any action in reliance on or regarding this information. If you have received this fax in error, please notify us immediately by telephone so that we can arrange for its return to [place]. Phone: [phone], Toll-Free: [phone], Fax: [phone] Page: 1 of 2 Call Id: [id number] [place] Patient Name: Peter Sanford CK Gender: Male DOB: 21-Nov-1965 Age: 49 Y 18 M 10 D Return Phone Number: [phone] (Primary), [phone] (Secondary) Address: City/State/Zip: [place] Night - Client Client Site [facility] [place], [place] Contact Type Call Call Type Triage / Clinical Relationship To Patient Self Return Phone Number [phone] (Primary) Chief Complaint Abdominal Pain Initial Comment Caller states that he had stomach cramping, periodic pain in right side (stabbing pain), and a low grade temp 100.8. PreDisposition Go to ED Nurse Assessment Nurse: Malva Cogan, RN, Juliann Pulse Date/Time Eilene Ghazi Time): 12/17/2014 9:02:09 PM Confirm and document reason for call. If symptomatic, describe symptoms. ---Caller states that he had onset of intermittent (R) flank pain this afternoon, also has low grade fever of 100.8 orally that started today as well. Has the patient traveled out of the country within the last 30 days? ---No Does the patient require triage? ---Yes Related visit to  physician within the last 2 weeks? ---No Does the PT have any chronic conditions? (i.e. diabetes, asthma, etc.) ---Yes List chronic conditions. ---depression Guidelines Guideline Title Affirmed Question Affirmed Notes Nurse Date/Time Eilene Ghazi Time) Flank Pain Fever > 100.5 F (38.1 C) Malva Cogan, RN, Juliann Pulse 12/17/2014 9:03:41 PM Disp. Time Eilene Ghazi Time) Disposition Final User 12/17/2014 9:06:31 PM See Physician within 4 Hours (or PCP triage) Yes Malva Cogan, RN, Gara Kroner Understands: Yes Disagree/Comply: Comply Care Advice Given Per Guideline PLEASE NOTE: All timestamps contained within this report are represented as [place] Standard Time. CONFIDENTIALTY NOTICE: This fax transmission is intended only for the addressee. It contains information that is legally privileged, confidential or otherwise protected from use or disclosure. If you are not the intended recipient, you are strictly prohibited from reviewing, disclosing, copying using or disseminating any of this information or taking any action in reliance on or regarding this information. If you have received this fax in error, please notify us immediately by telephone so that we can arrange for its return to [place]. Phone: [phone], Toll-Free: [phone], Fax: [phone] Page: 2 of 2 Call Id: [id number] Care Advice Given Per Guideline * IF NO PCP TRIAGE: You need to be seen. Go to _______________ (ED/UCC or office if it will be open) within the next 3 or 4 hours. Go sooner if you become worse. PAIN OR FEVER MEDICINES: * For pain and fever relief, take acetaminophen or ibuprofen. * Treat fevers above 101 F (38.3 C). * The goal of fever therapy is to bring the fever down to a comfortable level. Remember that fever medicine usually lowers fever 2-3 F (1-1.5 C). IBUPROFEN (E.G., MOTRIN, ADVIL): * Take 400 mg (two 200 mg pills) by mouth every 6 hours as needed. * Another choice is to take 600 mg (three 200 mg  pills) by mouth every 8  hours as needed. * The most you should take each day is 1,200 mg (six 200 mg pills a day), unless your doctor has told you to take more. EXTRA NOTES: * Before taking any medicine, read all the instructions on the package. CAUTION - NSAIDS (E.G., IBUPROFEN, NAPROXEN): * Do not take nonsteroidal anti-inflammatory drugs (NSAIDs) if you have stomach problems, kidney disease, heart failure, or other contraindications to using this type of medication. * Do not take NSAID medications for over 7 days without consulting your PCP. * You may take this medicine with or without food. Taking it with food or milk may lessen the chance the drug will upset your stomach. * GASTROINTESTINAL RISK: There is an increased risk of stomach ulcers, GI bleeding, perforation. * CARDIOVASCULAR RISK: There may be an increased risk of heart attack and stroke. CALL BACK IF: * You become worse. CARE ADVICE given per Flank Pain (Adult) guideline. After Care Instructions Given Call Event Type User Date / Time Description Referrals [facility] - ED

## 2014-12-18 NOTE — ED Notes (Signed)

## 2014-12-18 NOTE — ED Provider Notes (Signed)
[facility] Emergency Department Provider Note  ____________________________________________  Time seen: 11:45 PM  I have reviewed the triage vital signs and the nursing notes.   HISTORY  Chief Complaint Fever and Abdominal Pain      HPI Peter Sanford is a 49 y.o. male presents with right lower quadrant pain and fever. Patient was referred to the emergency department by his primary care physician for concern of possible sinusitis.patiently currently described the pain as mild and nonradiating. Patient denies any nausea no vomiting and no diarrhea.     Past Medical History  Diagnosis Date  . Pollen allergies   . History of nephrolithiasis   . GAD (generalized anxiety disorder)     Patient Active Problem List   Diagnosis Date Noted  . Generalized anxiety disorder 02/02/2011  . 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  . ALLERGIC RHINITIS 11/22/2006  . NEPHROLITHIASIS, HX OF 11/22/2006    History reviewed. No pertinent past surgical history.  Current Outpatient Rx  Name  Route  Sig  Dispense  Refill  . citalopram (CELEXA) 20 MG tablet      TAKE ONE TABLET BY MOUTH ONE TIME DAILY    30 tablet   2   . Loratadine (CLARITIN) 10 MG CAPS   Oral   Take 1 capsule by mouth daily as needed.             Allergies Review of patient's allergies indicates no known allergies.  No family history on file.  Social History History  Substance Use Topics  . Smoking status: Never Smoker   . Smokeless tobacco: Never Used  . Alcohol Use: Yes    Review of Systems  Constitutional: Negative for fever. Eyes: Negative for visual changes. ENT: Negative for sore throat. Cardiovascular: Negative for chest pain. Respiratory: Negative for shortness of breath. Gastrointestinal: Negative for abdominal pain, vomiting and diarrhea. Genitourinary: Negative for dysuria. Musculoskeletal: Negative for back pain. Skin: Negative for rash. Neurological: Negative for  headaches, focal weakness or numbness.   10-point ROS otherwise negative.  ____________________________________________   PHYSICAL EXAM:  VITAL SIGNS: ED Triage Vitals  Enc Vitals Group     BP 12/17/14 2212 117/73 mmHg     Pulse Rate 12/17/14 2212 59     Resp 12/17/14 2212 18     Temp 12/17/14 2212 98.7 F (37.1 C)     Temp Source 12/17/14 2212 Oral     SpO2 12/17/14 2212 98 %     Weight 12/17/14 2212 175 lb (79.379 kg)     Height 12/17/14 2212 5\' 9"  (1.753 m)     Head Cir --      Peak Flow --      Pain Score 12/17/14 2213 4     Pain Loc --      Pain Edu? --      Excl. in [place]? --     Constitutional: Alert and oriented. Well appearing and in no distress. Eyes: Conjunctivae are normal. PERRL. Normal extraocular movements. ENT   Head: Normocephalic and atraumatic.   Nose: No congestion/rhinnorhea.   Mouth/Throat: Mucous membranes are moist.   Neck: No stridor. Hematological/Lymphatic/Immunilogical: No cervical lymphadenopathy. Cardiovascular: Normal rate, regular rhythm. Normal and symmetric distal pulses are present in all extremities. No murmurs, rubs, or gallops. Respiratory: Normal respiratory effort without tachypnea nor retractions. Breath sounds are clear and equal bilaterally. No wheezes/rales/rhonchi. Gastrointestinal: Soft and nontender. No distention. There is no CVA tenderness. Genitourinary: deferred Musculoskeletal: Nontender with normal range of motion in all  extremities. No joint effusions.  No lower extremity tenderness nor edema. Neurologic:  Normal speech and language. No gross focal neurologic deficits are appreciated. Speech is normal.  Skin:  Skin is warm, dry and intact. No rash noted. Psychiatric: Mood and affect are normal. Speech and behavior are normal. Patient exhibits appropriate insight and judgment.  ____________________________________________    LABS (pertinent positives/negatives)  Labs Reviewed  COMPREHENSIVE METABOLIC  PANEL - Abnormal; Notable for the following:    Glucose, Bld 147 (*)    Calcium 8.8 (*)    All other components within normal limits  CBC WITH DIFFERENTIAL/PLATELET - Abnormal; Notable for the following:    Lymphs Abs 0.4 (*)    All other components within normal limits  URINALYSIS COMPLETEWITH MICROSCOPIC ([facility] ONLY) - Abnormal; Notable for the following:    Color, Urine YELLOW (*)    APPearance CLEAR (*)    All other components within normal limits         RADIOLOGY  CT abdomen and pelvis revealed:  IMPRESSION: 1. No acute abnormality seen within the abdomen or pelvis. 2. Mild mural thrombus and calcification noted along the distal abdominal aorta. No evidence of luminal narrowing. ____________________________________________    INITIAL IMPRESSION / ASSESSMENT AND PLAN / ED COURSE  Pertinent labs & imaging results that were available during my care of the patient were reviewed by me and considered in my medical decision making (see chart for details).  Patient CT scan revealed no evidence of appendicitis or any acute abnormality within the abdomen and pelvis. Laboratory data unremarkable  ____________________________________________   FINAL CLINICAL IMPRESSION(S) / ED DIAGNOSES  Final diagnoses:  Abdominal pain, unspecified abdominal location      Gregor Hams, MD 12/19/14 2345

## 2014-12-19 ENCOUNTER — Encounter: Payer: Self-pay | Admitting: Family Medicine

## 2014-12-19 ENCOUNTER — Ambulatory Visit (INDEPENDENT_AMBULATORY_CARE_PROVIDER_SITE_OTHER): Payer: BC Managed Care – PPO | Admitting: Family Medicine

## 2014-12-19 VITALS — BP 112/66 | HR 55 | Temp 98.7°F | Ht 68.0 in | Wt 179.0 lb

## 2014-12-19 DIAGNOSIS — F411 Generalized anxiety disorder: Secondary | ICD-10-CM | POA: Diagnosis not present

## 2014-12-19 MED ORDER — CITALOPRAM HYDROBROMIDE 20 MG PO TABS: 20.0000 mg | ORAL_TABLET | Freq: Every day | ORAL | Status: DC

## 2014-12-19 NOTE — Progress Notes (Signed)
Pre visit review using our clinic review tool, if applicable. No additional management support is needed unless otherwise documented below in the visit note. 

## 2014-12-19 NOTE — Progress Notes (Signed)
   Dr. Frederico Hamman T. Shreena Baines, MD, [facility] and [facility] Medicine [place], [address] Phone: [phone] Fax: [phone]  12/19/2014  Patient: Peter Sanford, MRN: [id number], DOB: October 24, 1965, 49 y.o.  Primary Physician:  Owens Loffler, MD  Chief Complaint: Medication Refill  Subjective:   Peter Sanford is a 49 y.o. very pleasant male patient who presents with the following:  Woke-up chills and fever.   Body mass index is 27.22 kg/(m^2).   Weight in (lb) to have BMI = 25: 164.1   He is essentially totally fine, and he just needs a refill on his Celexa.  Past Medical History, Surgical History, Social History, Family History, Problem List, Medications, and Allergies have been reviewed and updated if relevant.  Patient Active Problem List   Diagnosis Date Noted  . Generalized anxiety disorder 02/02/2011  . 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  . ALLERGIC RHINITIS 11/22/2006  . NEPHROLITHIASIS, HX OF 11/22/2006    Past Medical History  Diagnosis Date  . Pollen allergies   . History of nephrolithiasis   . GAD (generalized anxiety disorder)     No past surgical history on file.  History   Social History  . Marital Status: Married    Spouse Name: N/A  . Number of Children: N/A  . Years of Education: N/A   Occupational History  . Not on file.   Social History Main Topics  . Smoking status: Never Smoker   . Smokeless tobacco: Never Used  . Alcohol Use: 0.0 oz/week    0 Standard drinks or equivalent per week     Comment: occ  . Drug Use: No  . Sexual Activity: Not on file   Other Topics Concern  . Not on file   Social History Narrative    No family history on file.  No Known Allergies  Medication list reviewed and updated in full in [place].   GEN: No acute illnesses, no fevers, chills. GI: No n/v/d, eating normally Pulm: No SOB Interactive and getting along well at home.  Otherwise, ROS is  as per the HPI.  Objective:   BP 112/66 mmHg  Pulse 55  Temp(Src) 98.7 F (37.1 C) (Oral)  Ht 5\' 8"  (1.727 m)  Wt 179 lb (81.194 kg)  BMI 27.22 kg/m2  SpO2 97%  GEN: WDWN, NAD, Non-toxic, A & O x 3 HEENT: Atraumatic, Normocephalic. Neck supple. No masses, No LAD. Ears and Nose: No external deformity. CV: RRR, No M/G/R. No JVD. No thrill. No extra heart sounds. PULM: CTA B, no wheezes, crackles, rhonchi. No retractions. No resp. distress. No accessory muscle use. EXTR: No c/c/e NEURO Normal gait.  PSYCH: Normally interactive. Conversant. Not depressed or anxious appearing.  Calm demeanor.   Laboratory and Imaging Data:  Assessment and Plan:   Generalized anxiety disorder  Stable  Resolving URI now  Signed,  Terrye Dombrosky T. Cleon Signorelli, MD   Patient's Medications  New Prescriptions   No medications on file  Previous Medications   LORATADINE (CLARITIN) 10 MG CAPS    Take 1 capsule by mouth daily as needed.    Modified Medications   Modified Medication Previous Medication   CITALOPRAM (CELEXA) 20 MG TABLET citalopram (CELEXA) 20 MG tablet      Take 1 tablet (20 mg total) by mouth daily.    TAKE ONE TABLET BY MOUTH ONE TIME DAILY   Discontinued Medications   No medications on file

## 2015-12-16 ENCOUNTER — Telehealth: Payer: Self-pay | Admitting: Family Medicine

## 2015-12-16 NOTE — Telephone Encounter (Signed)
Please call and schedule CPE with fasting labs with Dr. Lorelei Pont.  If he doesn't want to do a physical, he must make a follow up appointment before any additional refills.

## 2015-12-23 NOTE — Telephone Encounter (Signed)
Left message asking asking pt to call office

## 2015-12-26 ENCOUNTER — Encounter: Payer: Self-pay | Admitting: Family Medicine

## 2015-12-26 NOTE — Telephone Encounter (Signed)
Left message asking pt to call office Mailed letter 

## 2016-01-22 NOTE — Telephone Encounter (Signed)
Patient scheduled physical on 02/11/16 and labs on 02/04/16.

## 2016-02-02 ENCOUNTER — Other Ambulatory Visit: Payer: Self-pay | Admitting: Family Medicine

## 2016-02-02 DIAGNOSIS — Z13220 Encounter for screening for lipoid disorders: Secondary | ICD-10-CM

## 2016-02-02 DIAGNOSIS — Z79899 Other long term (current) drug therapy: Secondary | ICD-10-CM

## 2016-02-04 ENCOUNTER — Other Ambulatory Visit (INDEPENDENT_AMBULATORY_CARE_PROVIDER_SITE_OTHER): Payer: BC Managed Care – PPO

## 2016-02-04 DIAGNOSIS — Z13220 Encounter for screening for lipoid disorders: Secondary | ICD-10-CM | POA: Diagnosis not present

## 2016-02-04 DIAGNOSIS — Z79899 Other long term (current) drug therapy: Secondary | ICD-10-CM

## 2016-02-04 DIAGNOSIS — R7309 Other abnormal glucose: Secondary | ICD-10-CM | POA: Diagnosis not present

## 2016-02-04 LAB — CBC WITH DIFFERENTIAL/PLATELET
BASOS PCT: 0.7 % (ref 0.0–3.0)
Basophils Absolute: 0 10*3/uL (ref 0.0–0.1)
Eosinophils Absolute: 0.4 10*3/uL (ref 0.0–0.7)
Eosinophils Relative: 8 % — ABNORMAL HIGH (ref 0.0–5.0)
HCT: 45.3 % (ref 39.0–52.0)
HEMOGLOBIN: 15.5 g/dL (ref 13.0–17.0)
LYMPHS PCT: 36.2 % (ref 12.0–46.0)
Lymphs Abs: 1.9 10*3/uL (ref 0.7–4.0)
MCHC: 34.3 g/dL (ref 30.0–36.0)
MCV: 91.8 fl (ref 78.0–100.0)
MONO ABS: 0.4 10*3/uL (ref 0.1–1.0)
Monocytes Relative: 7.6 % (ref 3.0–12.0)
NEUTROS PCT: 47.5 % (ref 43.0–77.0)
Neutro Abs: 2.5 10*3/uL (ref 1.4–7.7)
PLATELETS: 260 10*3/uL (ref 150.0–400.0)
RBC: 4.93 Mil/uL (ref 4.22–5.81)
RDW: 13.2 % (ref 11.5–15.5)
WBC: 5.2 10*3/uL (ref 4.0–10.5)

## 2016-02-04 LAB — HEPATIC FUNCTION PANEL
ALBUMIN: 4.1 g/dL (ref 3.5–5.2)
ALK PHOS: 46 U/L (ref 39–117)
ALT: 25 U/L (ref 0–53)
AST: 20 U/L (ref 0–37)
BILIRUBIN DIRECT: 0.1 mg/dL (ref 0.0–0.3)
Total Bilirubin: 0.4 mg/dL (ref 0.2–1.2)
Total Protein: 7.1 g/dL (ref 6.0–8.3)

## 2016-02-04 LAB — BASIC METABOLIC PANEL
BUN: 24 mg/dL — AB (ref 6–23)
CALCIUM: 9.4 mg/dL (ref 8.4–10.5)
CHLORIDE: 105 meq/L (ref 96–112)
CO2: 31 meq/L (ref 19–32)
CREATININE: 1.1 mg/dL (ref 0.40–1.50)
GFR: 75.44 mL/min (ref >60.00)
Glucose, Bld: 129 mg/dL — ABNORMAL HIGH (ref 70–99)
Potassium: 4.4 mEq/L (ref 3.5–5.1)
Sodium: 139 mEq/L (ref 135–145)

## 2016-02-04 LAB — LIPID PANEL
Cholesterol: 212 mg/dL — ABNORMAL HIGH (ref 0–200)
HDL: 45.2 mg/dL (ref >39.00)
LDL Cholesterol: 144 mg/dL — ABNORMAL HIGH (ref 0–99)
NonHDL: 166.46
Total CHOL/HDL Ratio: 5
Triglycerides: 113 mg/dL (ref 0.0–149.0)
VLDL: 22.6 mg/dL (ref 0.0–40.0)

## 2016-02-04 LAB — HEMOGLOBIN A1C: HEMOGLOBIN A1C: 6 % (ref 4.6–6.5)

## 2016-02-11 ENCOUNTER — Ambulatory Visit (INDEPENDENT_AMBULATORY_CARE_PROVIDER_SITE_OTHER): Payer: BC Managed Care – PPO | Admitting: Family Medicine

## 2016-02-11 ENCOUNTER — Encounter: Payer: Self-pay | Admitting: Family Medicine

## 2016-02-11 VITALS — BP 116/74 | HR 60 | Temp 98.2°F | Ht 68.0 in | Wt 183.8 lb

## 2016-02-11 DIAGNOSIS — R7303 Prediabetes: Secondary | ICD-10-CM | POA: Diagnosis not present

## 2016-02-11 DIAGNOSIS — Z0000 Encounter for general adult medical examination without abnormal findings: Secondary | ICD-10-CM | POA: Diagnosis not present

## 2016-02-11 NOTE — Progress Notes (Signed)
Pre visit review using our clinic review tool, if applicable. No additional management support is needed unless otherwise documented below in the visit note. 

## 2016-02-11 NOTE — Progress Notes (Signed)
Dr. Frederico Hamman T. Artie Mcintyre, MD, [facility] Primary [facility] and Sports Medicine [place], [address] Phone: [phone] Fax: [phone]  02/11/2016  Patient: Peter Sanford, MRN: [id number], DOB: August 04, 1965, 50 y.o.  Primary Physician:  Owens Loffler, MD   Chief Complaint  Patient presents with  . Annual Exam   Subjective:   ARCANGEL MINION is a 50 y.o. pleasant patient who presents with the following:  Preventative Health Maintenance Visit:  Health Maintenance Summary Reviewed and updated, unless pt declines services.  Tobacco History Reviewed. Alcohol: No concerns, no excessive use Exercise Habits: Running and ergSTD concerns: no risk or activity to increase risk Drug Use: None Encouraged self-testicular check  Health Maintenance  Topic Date Due  . HIV Screening  07/09/1981  . INFLUENZA VACCINE  01/27/2016  . TETANUS/TDAP  06/12/2023   Immunization History  Administered Date(s) Administered  . Influenza Split 03/16/2012  . Tdap 06/11/2013   Wt Readings from Last 3 Encounters:  02/11/16 183 lb 12 oz (83.3 kg)  12/19/14 179 lb (81.2 kg)  12/17/14 175 lb (79.4 kg)    Goal to lose 10 pounds  PF is bothering him, using an erg for 30 mins.  Diet.   Dr. Paediatric nurse in [place].   Knee - BCC on skin?  Patient Active Problem List   Diagnosis Date Noted  . Generalized anxiety disorder 02/02/2011  . 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  . ALLERGIC RHINITIS 11/22/2006  . NEPHROLITHIASIS, HX OF 11/22/2006   Past Medical History:  Diagnosis Date  . GAD (generalized anxiety disorder)   . History of nephrolithiasis   . Pollen allergies    No past surgical history on file. Social History   Social History  . Marital status: Married    Spouse name: N/A  . Number of children: N/A  . Years of education: N/A   Occupational History  . Not on file.   Social History Main Topics  . Smoking status: Never Smoker  . Smokeless  tobacco: Never Used  . Alcohol use 0.0 oz/week     Comment: occ  . Drug use: No  . Sexual activity: Not on file   Other Topics Concern  . Not on file   Social History Narrative  . No narrative on file   No family history on file. No Known Allergies  Medication list has been reviewed and updated.   General: Denies fever, chills, sweats. No significant weight loss. Eyes: Denies blurring,significant itching ENT: Denies earache, sore throat, and hoarseness. Cardiovascular: Denies chest pains, palpitations, dyspnea on exertion Respiratory: Denies cough, dyspnea at rest,wheeezing Breast: no concerns about lumps GI: Denies nausea, vomiting, diarrhea, constipation, change in bowel habits, abdominal pain, melena, hematochezia GU: Denies penile discharge, ED, urinary flow / outflow problems. No STD concerns. Musculoskeletal: Denies back pain, joint pain Derm: SLOWLY GROWING LESION ON THE LEFT KNEE Neuro: Denies  paresthesias, frequent falls, frequent headaches Psych: Denies depression, anxiety Endocrine: Denies cold intolerance, heat intolerance, polydipsia Heme: Denies enlarged lymph nodes Allergy: No hayfever  Objective:   BP 116/74   Pulse 60   Temp 98.2 F (36.8 C) (Oral)   Ht _0  (1.727 m)   Wt 183 lb 12 oz (83.3 kg)   BMI 27.94 kg/m  Ideal Body Weight: Weight in (lb) to have BMI = 25: 164.1  No exam data present  GEN: well developed, well nourished, no acute distress Eyes: conjunctiva and lids normal, PERRLA, EOMI ENT: TM clear, nares clear, oral exam  WNL Neck: supple, no lymphadenopathy, no thyromegaly, no JVD Pulm: clear to auscultation and percussion, respiratory effort normal CV: regular rate and rhythm, S1-S2, no murmur, rub or gallop, no bruits, peripheral pulses normal and symmetric, no cyanosis, clubbing, edema or varicosities GI: soft, non-tender; no hepatosplenomegaly, masses; active bowel sounds all quadrants GU: no hernia, testicular mass, penile  discharge Lymph: no cervical, axillary or inguinal adenopathy MSK: gait normal, muscle tone and strength WNL, no joint swelling, effusions, discoloration, crepitus  SKIN: L knee, flesh colored skin lesion about size of dime Neuro: normal mental status, normal strength, sensation, and motion Psych: alert; oriented to person, place and time, normally interactive and not anxious or depressed in appearance.  All labs reviewed with patient.  Lipids:    Component Value Date/Time   CHOL 212 (H) 02/04/2016 0931   TRIG 113.0 02/04/2016 0931   HDL 45.20 02/04/2016 0931   LDLDIRECT 168.9 06/05/2013 1102   VLDL 22.6 02/04/2016 0931   CHOLHDL 5 02/04/2016 0931   CBC: CBC Latest Ref Rng & Units 02/04/2016 12/17/2014 06/05/2013  WBC 4.0 - 10.5 K/uL 5.2 6.3 4.6  Hemoglobin 13.0 - 17.0 g/dL 15.5 15.6 15.2  Hematocrit 39.0 - 52.0 % 45.3 47.1 44.8  Platelets 150.0 - 400.0 K/uL 260.0 214 244.0   Lab Results  Component Value Date   HGBA1C 6.0 02/04/2016     Basic Metabolic Panel:    Component Value Date/Time   NA 139 02/04/2016 0931   K 4.4 02/04/2016 0931   CL 105 02/04/2016 0931   CO2 31 02/04/2016 0931   BUN 24 (H) 02/04/2016 0931   CREATININE 1.10 02/04/2016 0931   GLUCOSE 129 (H) 02/04/2016 0931   CALCIUM 9.4 02/04/2016 0931   Hepatic Function Latest Ref Rng & Units 02/04/2016 12/17/2014 06/05/2013  Total Protein 6.0 - 8.3 g/dL 7.1 7.3 7.3  Albumin 3.5 - 5.2 g/dL 4.1 4.1 4.1  AST 0 - 37 U/L 20 32 23  ALT 0 - 53 U/L _0 Alk Phosphatase 39 - 117 U/L 46 50 42  Total Bilirubin 0.2 - 1.2 mg/dL 0.4 1.1 0.8  Bilirubin, Direct 0.0 - 0.3 mg/dL 0.1 - 0.0    No results found for: TSH No results found for: PSA  Assessment and Plan:   Healthcare maintenance  To see his derm, ? [place]  Health Maintenance Exam: The patient's preventative maintenance and recommended screening tests for an annual wellness exam were reviewed in full today. Brought up to date unless services  declined.  Counselled on the importance of diet, exercise, and its role in overall health and mortality. The patient's FH and SH was reviewed, including their home life, tobacco status, and drug and alcohol status.  Follow-up: No Follow-up on file. Unless noted, follow-up in 1 year for Health Maintenance Exam.  Signed,  Frederico Hamman T. Ugo Thoma, MD   Patient's Medications  New Prescriptions   No medications on file  Previous Medications   CITALOPRAM (CELEXA) 20 MG TABLET    TAKE 1 TABLET (20 MG TOTAL) BY MOUTH DAILY.   LORATADINE (CLARITIN) 10 MG CAPS    Take 1 capsule by mouth daily as needed.    Modified Medications   No medications on file  Discontinued Medications   No medications on file

## 2016-03-16 ENCOUNTER — Other Ambulatory Visit: Payer: Self-pay | Admitting: Family Medicine

## 2016-09-14 ENCOUNTER — Other Ambulatory Visit: Payer: Self-pay | Admitting: Family Medicine

## 2017-03-15 ENCOUNTER — Other Ambulatory Visit: Payer: Self-pay | Admitting: Family Medicine

## 2017-04-16 ENCOUNTER — Other Ambulatory Visit: Payer: Self-pay | Admitting: Family Medicine

## 2017-04-18 ENCOUNTER — Telehealth: Payer: Self-pay

## 2017-04-18 NOTE — Telephone Encounter (Signed)
Refilled earlier today for #15 with no refills.  Peter Sanford needs an office visit.

## 2017-04-18 NOTE — Telephone Encounter (Signed)
Copied from [place] #[id number]. Topic: Quick Communication - See Telephone Encounter >> Apr 18, 2017  9:36 AM Malena Catholic I, NT wrote: CRM for notification. See Telephone encounter for:  RX REFILL 04/18/17.  [facility] [place]

## 2017-04-18 NOTE — Telephone Encounter (Signed)
Patient is calling for a refill of medication- Celexa 20 mg

## 2017-04-25 ENCOUNTER — Ambulatory Visit (INDEPENDENT_AMBULATORY_CARE_PROVIDER_SITE_OTHER): Payer: BC Managed Care – PPO | Admitting: Family Medicine

## 2017-04-25 ENCOUNTER — Encounter: Payer: Self-pay | Admitting: Family Medicine

## 2017-04-25 VITALS — BP 118/72 | HR 49 | Temp 98.6°F | Ht 67.5 in | Wt 182.5 lb

## 2017-04-25 DIAGNOSIS — Z0000 Encounter for general adult medical examination without abnormal findings: Secondary | ICD-10-CM

## 2017-04-25 DIAGNOSIS — R7303 Prediabetes: Secondary | ICD-10-CM

## 2017-04-25 MED ORDER — CITALOPRAM HYDROBROMIDE 20 MG PO TABS: 20.0000 mg | ORAL_TABLET | Freq: Every day | ORAL | 3 refills | Status: DC

## 2017-04-25 NOTE — Patient Instructions (Signed)

## 2017-04-25 NOTE — Progress Notes (Signed)
Dr. Frederico Hamman T. Darragh Nay, MD, [facility] [place], [address] Phone: [phone] Fax: [phone]  04/25/2017  Patient: Peter Sanford, MRN: [id number], DOB: 03-Jun-1966, 51 y.o.  Primary Physician:  Owens Loffler, MD   CPX:  Subjective:   Peter Sanford is a 51 y.o. pleasant patient who presents with the following:  Preventative Health Maintenance Visit:  Health Maintenance Summary Reviewed and updated, unless pt declines services.  Tobacco History Reviewed. Alcohol: No concerns, no excessive use Exercise Habits: Some activity, rec at least 30 mins 5 times a week STD concerns: no risk or activity to increase risk Drug Use: None Encouraged self-testicular check  Health Maintenance  Topic Date Due  . HIV Screening  07/09/1981  . COLONOSCOPY  07/09/2016  . TETANUS/TDAP  06/12/2023  . INFLUENZA VACCINE  Completed   Immunization History  Administered Date(s) Administered  . Influenza Nasal 05/14/2015  . Influenza Split 03/16/2012  . Influenza,inj,Quad PF,6+ Mos 04/15/2017  . Tdap 06/11/2013   Patient Active Problem List   Diagnosis Date Noted  . Prediabetes 02/11/2016  . Generalized anxiety disorder 02/02/2011  . 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  . ALLERGIC RHINITIS 11/22/2006  . NEPHROLITHIASIS, HX OF 11/22/2006   Past Medical History:  Diagnosis Date  . GAD (generalized anxiety disorder)   . History of nephrolithiasis   . Pollen allergies    No past surgical history on file. Social History   Social History  . Marital status: Married    Spouse name: N/A  . Number of children: N/A  . Years of education: N/A   Occupational History  . Not on file.   Social History Main Topics  . Smoking status: Never Smoker  . Smokeless tobacco: Never Used  . Alcohol use 0.0 oz/week     Comment: occ  . Drug use: No  . Sexual activity: Not on file   Other Topics Concern  . Not on file    Social History Narrative  . No narrative on file   No family history on file. No Known Allergies  Medication list has been reviewed and updated.   General: Denies fever, chills, sweats. No significant weight loss. Eyes: Denies blurring,significant itching ENT: Denies earache, sore throat, and hoarseness. Cardiovascular: Denies chest pains, palpitations, dyspnea on exertion Respiratory: Denies cough, dyspnea at rest,wheeezing Breast: no concerns about lumps GI: Denies nausea, vomiting, diarrhea, constipation, change in bowel habits, abdominal pain, melena, hematochezia GU: Denies penile discharge, ED, urinary flow / outflow problems. No STD concerns. Musculoskeletal: Denies back pain, joint pain Derm: Denies rash, itching Neuro: Denies  paresthesias, frequent falls, frequent headaches Psych: Denies depression, anxiety Endocrine: Denies cold intolerance, heat intolerance, polydipsia Heme: Denies enlarged lymph nodes Allergy: No hayfever  Objective:   BP 118/72   Pulse (!) 49   Temp 98.6 F (37 C) (Oral)   Ht 5' 7.5" (1.715 m)   Wt 182 lb 8 oz (82.8 kg)   BMI 28.16 kg/m  Ideal Body Weight: Weight in (lb) to have BMI = 25: 161.7  No exam data present  GEN: well developed, well nourished, no acute distress Eyes: conjunctiva and lids normal, PERRLA, EOMI ENT: TM clear, nares clear, oral exam WNL Neck: supple, no lymphadenopathy, no thyromegaly, no JVD Pulm: clear to auscultation and percussion, respiratory effort normal CV: regular rate and rhythm, S1-S2, no murmur, rub or gallop, no bruits, peripheral pulses normal and symmetric, no cyanosis, clubbing, edema or varicosities GI: soft, non-tender;  no hepatosplenomegaly, masses; active bowel sounds all quadrants GU: no hernia, testicular mass, penile discharge Lymph: no cervical, axillary or inguinal adenopathy MSK: gait normal, muscle tone and strength WNL, no joint swelling, effusions, discoloration, crepitus  SKIN:  clear, good turgor, color WNL, no rashes, lesions, or ulcerations Neuro: normal mental status, normal strength, sensation, and motion Psych: alert; oriented to person, place and time, normally interactive and not anxious or depressed in appearance. All labs reviewed with patient.  Lipids:    Component Value Date/Time   CHOL 212 (H) 02/04/2016 0931   TRIG 113.0 02/04/2016 0931   HDL 45.20 02/04/2016 0931   LDLDIRECT 168.9 06/05/2013 1102   VLDL 22.6 02/04/2016 0931   CHOLHDL 5 02/04/2016 0931   CBC: CBC Latest Ref Rng & Units 02/04/2016 12/17/2014 06/05/2013  WBC 4.0 - 10.5 K/uL 5.2 6.3 4.6  Hemoglobin 13.0 - 17.0 g/dL 15.5 15.6 15.2  Hematocrit 39.0 - 52.0 % 45.3 47.1 44.8  Platelets 150.0 - 400.0 K/uL 260.0 214 287.8    Basic Metabolic Panel:    Component Value Date/Time   NA 139 02/04/2016 0931   K 4.4 02/04/2016 0931   CL 105 02/04/2016 0931   CO2 31 02/04/2016 0931   BUN 24 (H) 02/04/2016 0931   CREATININE 1.10 02/04/2016 0931   GLUCOSE 129 (H) 02/04/2016 0931   CALCIUM 9.4 02/04/2016 0931   Hepatic Function Latest Ref Rng & Units 02/04/2016 12/17/2014 06/05/2013  Total Protein 6.0 - 8.3 g/dL 7.1 7.3 7.3  Albumin 3.5 - 5.2 g/dL 4.1 4.1 4.1  AST 0 - 37 U/L 20 32 23  ALT 0 - 53 U/L '25 25 23  ' Alk Phosphatase 39 - 117 U/L 46 50 42  Total Bilirubin 0.2 - 1.2 mg/dL 0.4 1.1 0.8  Bilirubin, Direct 0.0 - 0.3 mg/dL 0.1 - 0.0    No results found for: TSH No results found for: PSA  Assessment and Plan:   Healthcare maintenance - Plan: Ambulatory referral to Gastroenterology, Basic metabolic panel, CBC with Differential/Platelet, Hepatic function panel, Hemoglobin A1c, PSA  Prediabetes - Plan: Hemoglobin A1c  Doing well overall  Health Maintenance Exam: The patient's preventative maintenance and recommended screening tests for an annual wellness exam were reviewed in full today. Brought up to date unless services declined.  Counselled on the importance of diet, exercise, and  its role in overall health and mortality. The patient's FH and SH was reviewed, including their home life, tobacco status, and drug and alcohol status.  Follow-up in 1 year for physical exam or additional follow-up below.  Follow-up: No Follow-up on file. Or follow-up in 1 year if not noted.  Meds ordered this encounter  Medications  . citalopram (CELEXA) 20 MG tablet    Sig: Take 1 tablet (20 mg total) by mouth daily.    Dispense:  90 tablet    Refill:  3   Medications Discontinued During This Encounter  Medication Reason  . citalopram (CELEXA) 20 MG tablet Reorder   Orders Placed This Encounter  Procedures  . Basic metabolic panel  . CBC with Differential/Platelet  . Hepatic function panel  . Hemoglobin A1c  . PSA  . Ambulatory referral to Gastroenterology    Signed,  Frederico Hamman T. Quintarius Ferns, MD   Allergies as of 04/25/2017   No Known Allergies     Medication List       Accurate as of 04/25/17  3:59 PM. Always use your most recent med list.  citalopram 20 MG tablet Commonly known as:  CELEXA Take 1 tablet (20 mg total) by mouth daily.   CLARITIN 10 MG Caps Generic drug:  Loratadine Take 1 capsule by mouth daily as needed.

## 2017-04-26 LAB — BASIC METABOLIC PANEL
BUN: 17 mg/dL (ref 6–23)
CALCIUM: 9.3 mg/dL (ref 8.4–10.5)
CO2: 28 mEq/L (ref 19–32)
CREATININE: 1.08 mg/dL (ref 0.40–1.50)
Chloride: 105 mEq/L (ref 96–112)
GFR: 76.68 mL/min (ref >60.00)
GLUCOSE: 86 mg/dL (ref 70–99)
POTASSIUM: 4.5 meq/L (ref 3.5–5.1)
Sodium: 140 mEq/L (ref 135–145)

## 2017-04-26 LAB — CBC WITH DIFFERENTIAL/PLATELET
BASOS ABS: 0 10*3/uL (ref 0.0–0.1)
Basophils Relative: 0.9 % (ref 0.0–3.0)
Eosinophils Absolute: 0.3 10*3/uL (ref 0.0–0.7)
Eosinophils Relative: 6.5 % — ABNORMAL HIGH (ref 0.0–5.0)
HCT: 44.9 % (ref 39.0–52.0)
HEMOGLOBIN: 14.9 g/dL (ref 13.0–17.0)
LYMPHS ABS: 1.6 10*3/uL (ref 0.7–4.0)
Lymphocytes Relative: 32.8 % (ref 12.0–46.0)
MCHC: 33.3 g/dL (ref 30.0–36.0)
MCV: 95.4 fl (ref 78.0–100.0)
Monocytes Absolute: 0.4 10*3/uL (ref 0.1–1.0)
Monocytes Relative: 7.5 % (ref 3.0–12.0)
Neutro Abs: 2.5 10*3/uL (ref 1.4–7.7)
Neutrophils Relative %: 52.3 % (ref 43.0–77.0)
Platelets: 214 10*3/uL (ref 150.0–400.0)
RBC: 4.7 Mil/uL (ref 4.22–5.81)
RDW: 12.7 % (ref 11.5–15.5)
WBC: 4.8 10*3/uL (ref 4.0–10.5)

## 2017-04-26 LAB — PSA: PSA: 0.74 ng/mL (ref 0.10–4.00)

## 2017-04-26 LAB — HEPATIC FUNCTION PANEL
ALBUMIN: 4.1 g/dL (ref 3.5–5.2)
ALK PHOS: 48 U/L (ref 39–117)
ALT: 19 U/L (ref 0–53)
AST: 17 U/L (ref 0–37)
Bilirubin, Direct: 0.1 mg/dL (ref 0.0–0.3)
Total Bilirubin: 0.6 mg/dL (ref 0.2–1.2)
Total Protein: 6.5 g/dL (ref 6.0–8.3)

## 2017-04-26 LAB — HEMOGLOBIN A1C: Hgb A1c MFr Bld: 6 % (ref 4.6–6.5)

## 2017-05-08 IMAGING — CT CT ABD-PELV W/ CM
1 of 3 series · 14 of 32 positions shown, 19 images · IV contrast (omnipaque)
Comparison: None.

CLINICAL DATA: Acute onset of right lower quadrant discomfort,
nausea and fever. Initial encounter.

EXAM:
CT ABDOMEN AND PELVIS WITH CONTRAST
TECHNIQUE: Multidetector CT imaging of the abdomen and pelvis was performed
using the standard protocol following bolus administration of
intravenous contrast.
CONTRAST:  100mL OMNIPAQUE IOHEXOL 300 MG/ML  SOLN

[Series 2: routine abd pel with · axial · 0.73mm/px · z∈[-480,-56]mm · 14 of 97 slices shown, 19 images]
[im 6/97  soft-tissue]
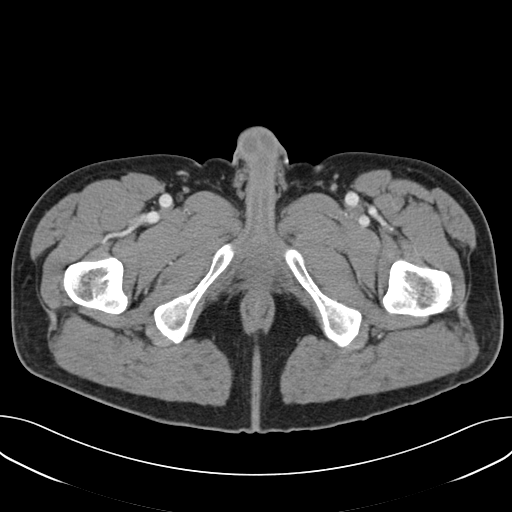
[im 6/97  bone]
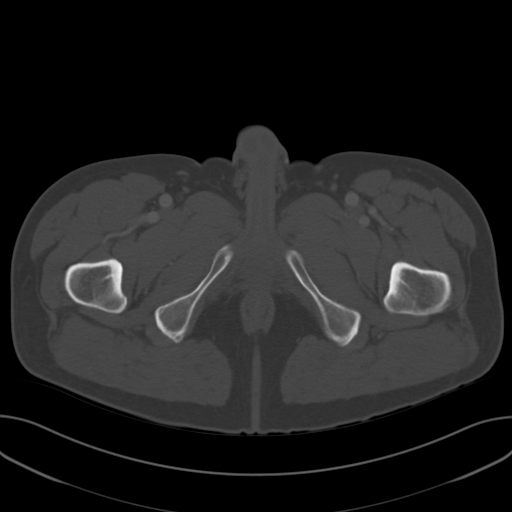
[im 16/97  soft-tissue]
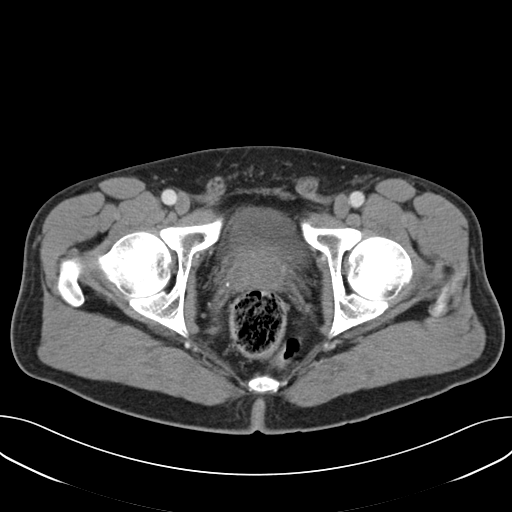
[im 21/97  soft-tissue]
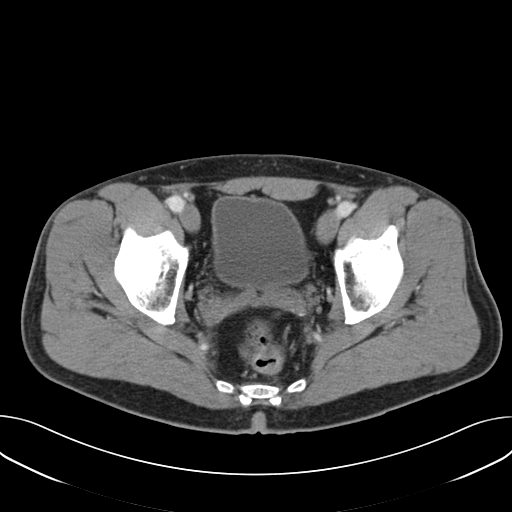
[im 26/97  soft-tissue]
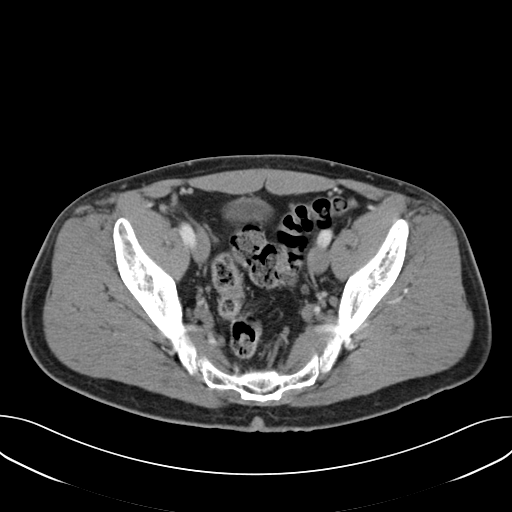
[im 36/97  soft-tissue]
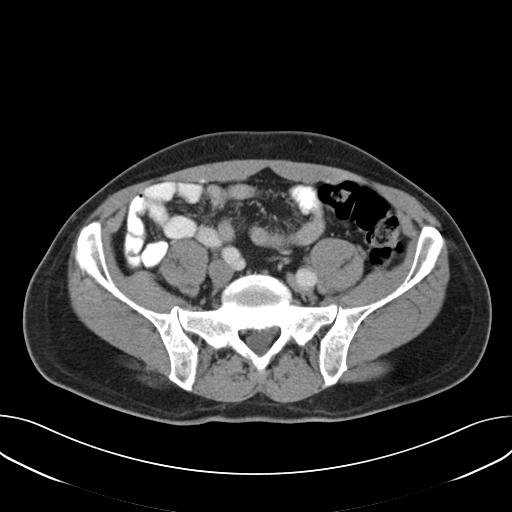
[im 41/97  soft-tissue]
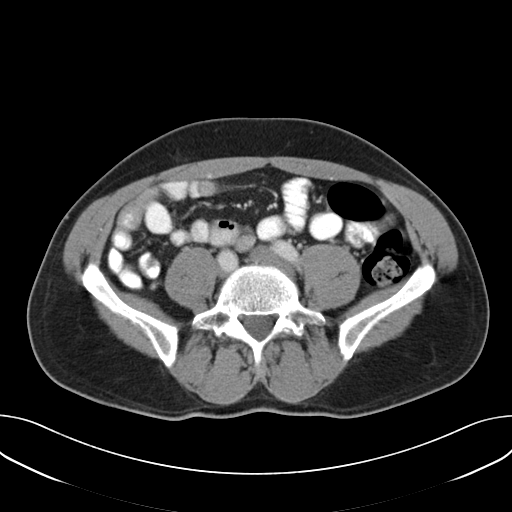
[im 51/97  soft-tissue]
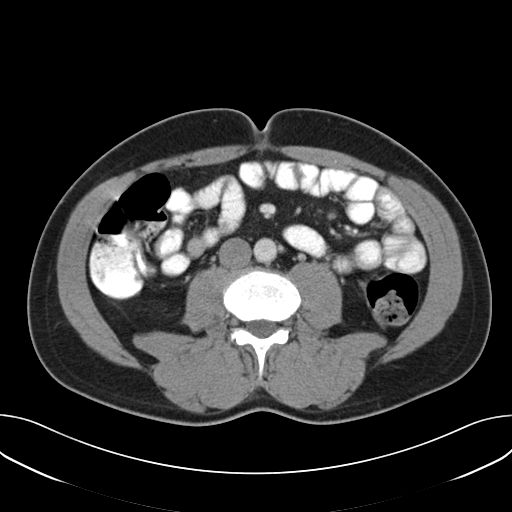
[im 56/97  soft-tissue]
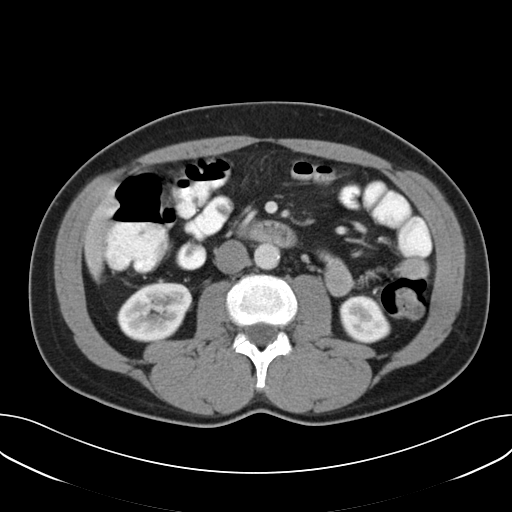
[im 61/97  soft-tissue]
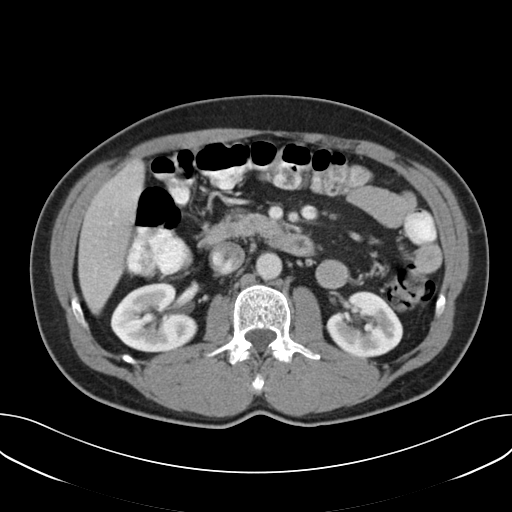
[im 61/97  bone]
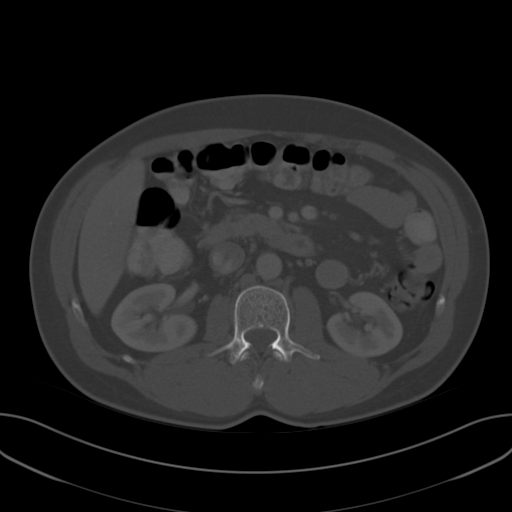
[im 71/97  soft-tissue]
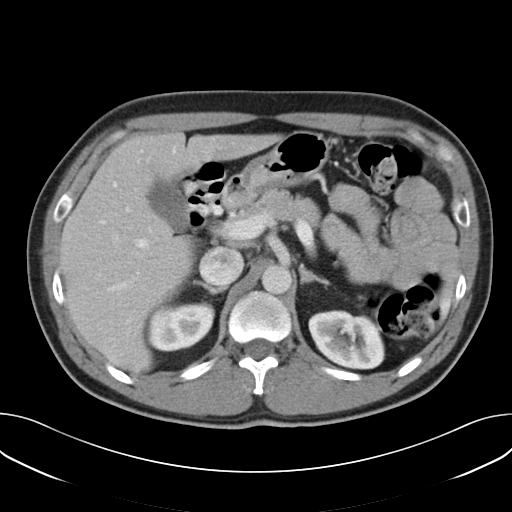
[im 76/97  soft-tissue]
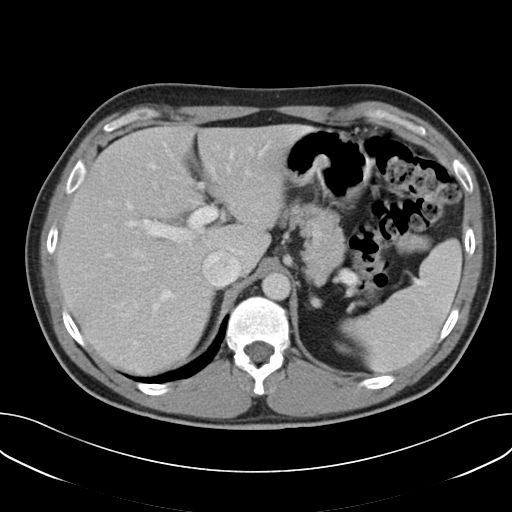
[im 76/97  lung]
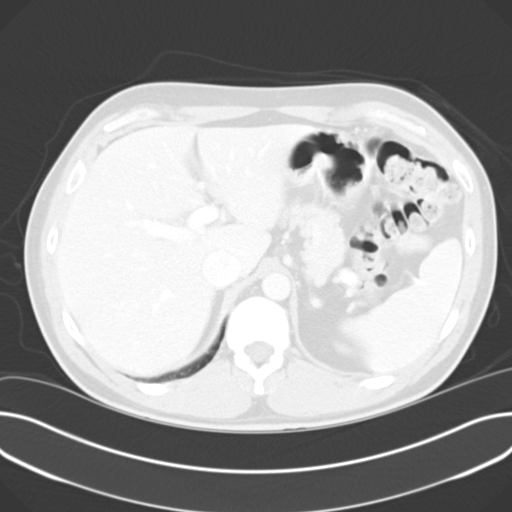
[im 81/97  soft-tissue]
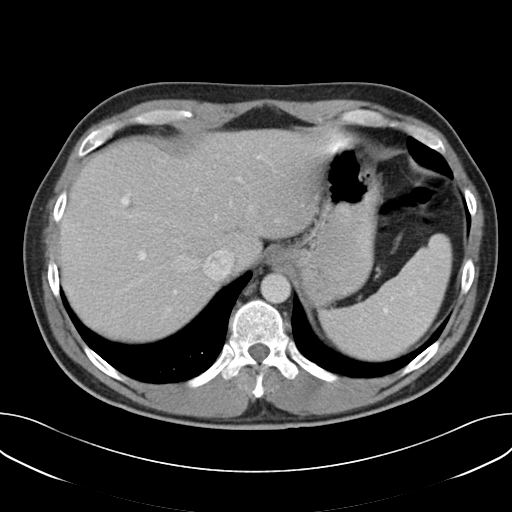
[im 81/97  lung]
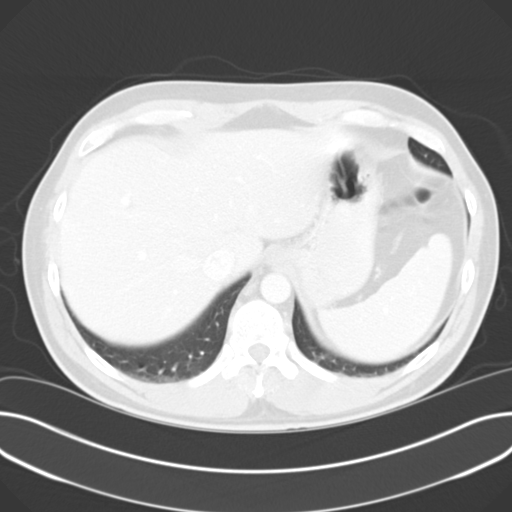
[im 86/97  lung]
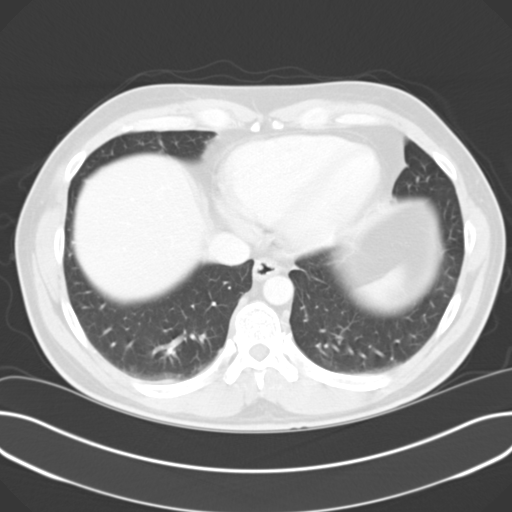
[im 91/97  soft-tissue]
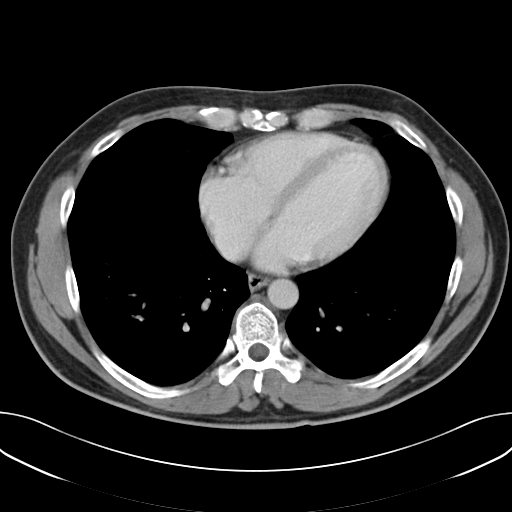
[im 91/97  lung]
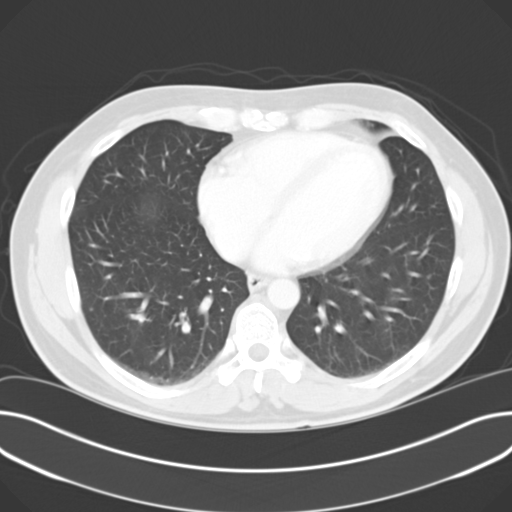

[14 of 32 positions shown; findings below may reference images not displayed]

FINDINGS: Minimal bibasilar atelectasis is noted.

The liver and spleen are unremarkable in appearance. The gallbladder
is within normal limits. The pancreas and adrenal glands are
unremarkable.

The kidneys are unremarkable in appearance. There is no evidence of
hydronephrosis. No renal or ureteral stones are seen. No perinephric
stranding is appreciated.

No free fluid is identified. The small bowel is unremarkable in
appearance. The stomach is within normal limits. No acute vascular
abnormalities are seen. Mild mural thrombus is noted along the
distal abdominal aorta, with associated calcification.

The appendix is normal in caliber without evidence for appendicitis.
Contrast progresses to the level of the mid transverse colon. The
colon is unremarkable in appearance.

The bladder is mildly distended and grossly unremarkable. The
prostate remains normal in size. No inguinal lymphadenopathy is
seen.

No acute osseous abnormalities are identified.
IMPRESSION: 1. No acute abnormality seen within the abdomen or pelvis.
2. Mild mural thrombus and calcification noted along the distal
abdominal aorta. No evidence of luminal narrowing.

## 2017-05-12 ENCOUNTER — Telehealth: Payer: Self-pay | Admitting: Gastroenterology

## 2017-05-12 NOTE — Telephone Encounter (Signed)
Patient left a voice message that he has been referred for a colonoscopy and it has been about 4 weeks. Please call him ASAP to schedule.

## 2017-05-13 ENCOUNTER — Other Ambulatory Visit: Payer: Self-pay

## 2017-05-13 ENCOUNTER — Telehealth: Payer: Self-pay

## 2017-05-13 DIAGNOSIS — Z1211 Encounter for screening for malignant neoplasm of colon: Secondary | ICD-10-CM

## 2017-05-13 NOTE — Telephone Encounter (Signed)
Gastroenterology Pre-Procedure Review  Request Date: 12/7 Requesting Physician: Dr. Allen Norris  PATIENT REVIEW QUESTIONS: The patient responded to the following health history questions as indicated:    1. Are you having any GI issues? no 2. Do you have a personal history of Polyps? no 3. Do you have a family history of Colon Cancer or Polyps? no 4. Diabetes Mellitus? no 5. Joint replacements in the past 12 months?no 6. Major health problems in the past 3 months?no 7. Any artificial heart valves, MVP, or defibrillator?no    MEDICATIONS & ALLERGIES:    Patient reports the following regarding taking any anticoagulation/antiplatelet therapy:   Plavix, Coumadin, Eliquis, Xarelto, Lovenox, Pradaxa, Brilinta, or Effient? no Aspirin? no  Patient confirms/reports the following medications:  Current Outpatient Medications  Medication Sig Dispense Refill  . citalopram (CELEXA) 20 MG tablet Take 1 tablet (20 mg total) by mouth daily. 90 tablet 3  . Loratadine (CLARITIN) 10 MG CAPS Take 1 capsule by mouth daily as needed.       No current facility-administered medications for this visit.     Patient confirms/reports the following allergies:  No Known Allergies  No orders of the defined types were placed in this encounter.   AUTHORIZATION INFORMATION Primary Insurance: 1D#: Group #:  Secondary Insurance: 1D#: Group #:  SCHEDULE INFORMATION: Date: 12/7 Time: Location: [place]

## 2017-05-26 ENCOUNTER — Encounter: Payer: Self-pay | Admitting: *Deleted

## 2017-05-26 ENCOUNTER — Other Ambulatory Visit: Payer: Self-pay

## 2017-06-02 NOTE — Discharge Instructions (Signed)
General Anesthesia, Adult, Care After °These instructions provide you with information about caring for yourself after your procedure. Your health care provider may also give you more specific instructions. Your treatment has been planned according to current medical practices, but problems sometimes occur. Call your health care provider if you have any problems or questions after your procedure. °What can I expect after the procedure? °After the procedure, it is common to have: °· Vomiting. °· A sore throat. °· Mental slowness. ° °It is common to feel: °· Nauseous. °· Cold or shivery. °· Sleepy. °· Tired. °· Sore or achy, even in parts of your body where you did not have surgery. ° °Follow these instructions at home: °For at least 24 hours after the procedure: °· Do not: °? Participate in activities where you could fall or become injured. °? Drive. °? Use heavy machinery. °? Drink alcohol. °? Take sleeping pills or medicines that cause drowsiness. °? Make important decisions or sign legal documents. °? Take care of children on your own. °· Rest. °Eating and drinking °· If you vomit, drink water, juice, or soup when you can drink without vomiting. °· Drink enough fluid to keep your urine clear or pale yellow. °· Make sure you have little or no nausea before eating solid foods. °· Follow the diet recommended by your health care provider. °General instructions °· Have a responsible adult stay with you until you are awake and alert. °· Return to your normal activities as told by your health care provider. Ask your health care provider what activities are safe for you. °· Take over-the-counter and prescription medicines only as told by your health care provider. °· If you smoke, do not smoke without supervision. °· Keep all follow-up visits as told by your health care provider. This is important. °Contact a health care provider if: °· You continue to have nausea or vomiting at home, and medicines are not helpful. °· You  cannot drink fluids or start eating again. °· You cannot urinate after 8-12 hours. °· You develop a skin rash. °· You have fever. °· You have increasing redness at the site of your procedure. °Get help right away if: °· You have difficulty breathing. °· You have chest pain. °· You have unexpected bleeding. °· You feel that you are having a life-threatening or urgent problem. °This information is not intended to replace advice given to you by your health care provider. Make sure you discuss any questions you have with your health care provider. °Document Released: 09/20/2000 Document Revised: 11/17/2015 Document Reviewed: 05/29/2015 °Elsevier Interactive Patient Education © 2018 [employer] Inc. ° °

## 2017-06-03 ENCOUNTER — Encounter
Admission: RE | Disposition: A | Discharge status: Home / Self Care | Payer: Self-pay | Source: Ambulatory Visit | Attending: Gastroenterology

## 2017-06-03 ENCOUNTER — Ambulatory Visit: Payer: BC Managed Care – PPO | Admitting: Anesthesiology

## 2017-06-03 ENCOUNTER — Ambulatory Visit
Admission: RE | Admit: 2017-06-03 | Discharge: 2017-06-03 | Disposition: A | Discharge status: Home / Self Care | Payer: BC Managed Care – PPO | Source: Ambulatory Visit | Attending: Gastroenterology | Admitting: Gastroenterology

## 2017-06-03 DIAGNOSIS — F411 Generalized anxiety disorder: Secondary | ICD-10-CM | POA: Insufficient documentation

## 2017-06-03 DIAGNOSIS — J301 Allergic rhinitis due to pollen: Secondary | ICD-10-CM | POA: Diagnosis not present

## 2017-06-03 DIAGNOSIS — K640 First degree hemorrhoids: Secondary | ICD-10-CM | POA: Diagnosis not present

## 2017-06-03 DIAGNOSIS — D127 Benign neoplasm of rectosigmoid junction: Secondary | ICD-10-CM | POA: Diagnosis not present

## 2017-06-03 DIAGNOSIS — Z1211 Encounter for screening for malignant neoplasm of colon: Secondary | ICD-10-CM | POA: Diagnosis not present

## 2017-06-03 DIAGNOSIS — Z79899 Other long term (current) drug therapy: Secondary | ICD-10-CM | POA: Insufficient documentation

## 2017-06-03 HISTORY — PX: POLYPECTOMY: SHX5525

## 2017-06-03 HISTORY — PX: COLONOSCOPY WITH PROPOFOL: SHX5780

## 2017-06-03 HISTORY — DX: Presence of spectacles and contact lenses: Z97.3

## 2017-06-03 SURGERY — COLONOSCOPY WITH PROPOFOL
Anesthesia: General | Wound class: Clean Contaminated

## 2017-06-03 MED ORDER — LIDOCAINE HCL (CARDIAC) 20 MG/ML IV SOLN
INTRAVENOUS | Status: DC | PRN
  Administered 2017-06-03: 40 mg via INTRAVENOUS

## 2017-06-03 MED ORDER — LACTATED RINGERS IV SOLN
INTRAVENOUS | Status: DC
  Administered 2017-06-03 (×2): via INTRAVENOUS

## 2017-06-03 MED ORDER — PROPOFOL 10 MG/ML IV BOLUS
INTRAVENOUS | Status: DC | PRN
  Administered 2017-06-03: 80 mg via INTRAVENOUS
  Administered 2017-06-03: 20 mg via INTRAVENOUS
  Administered 2017-06-03: 30 mg via INTRAVENOUS
  Administered 2017-06-03 (×6): 20 mg via INTRAVENOUS

## 2017-06-03 MED ORDER — SODIUM CHLORIDE 0.9 % IV SOLN: INTRAVENOUS | Status: DC

## 2017-06-03 MED ORDER — ACETAMINOPHEN 160 MG/5ML PO SOLN: 325.0000 mg | Freq: Once | ORAL | Status: DC

## 2017-06-03 MED ORDER — ACETAMINOPHEN 325 MG PO TABS: 325.0000 mg | ORAL_TABLET | Freq: Once | ORAL | Status: DC

## 2017-06-03 SURGICAL SUPPLY — 23 items
CANISTER SUCT 1200ML W/VALVE (MISCELLANEOUS) ×3 IMPLANT
CLIP HMST 235XBRD CATH ROT (MISCELLANEOUS) IMPLANT
CLIP RESOLUTION 360 11X235 (MISCELLANEOUS)
FCP ESCP3.2XJMB 240X2.8X (MISCELLANEOUS)
FORCEPS BIOP RAD 4 LRG CAP 4 (CUTTING FORCEPS) IMPLANT
FORCEPS BIOP RJ4 240 W/NDL (MISCELLANEOUS)
FORCEPS ESCP3.2XJMB 240X2.8X (MISCELLANEOUS) IMPLANT
GOWN CVR UNV OPN BCK APRN NK (MISCELLANEOUS) ×4 IMPLANT
GOWN ISOL THUMB LOOP REG UNIV (MISCELLANEOUS) ×6
INJECTOR VARIJECT VIN23 (MISCELLANEOUS) IMPLANT
KIT DEFENDO VALVE AND CONN (KITS) IMPLANT
KIT ENDO PROCEDURE OLY (KITS) ×3 IMPLANT
MARKER SPOT ENDO TATTOO 5ML (MISCELLANEOUS) IMPLANT
PAD GROUND ADULT SPLIT (MISCELLANEOUS) IMPLANT
PROBE APC STR FIRE (PROBE) IMPLANT
RETRIEVER NET ROTH 2.5X230 LF (MISCELLANEOUS) IMPLANT
SNARE SHORT THROW 13M SML OVAL (MISCELLANEOUS) ×1 IMPLANT
SNARE SHORT THROW 30M LRG OVAL (MISCELLANEOUS) IMPLANT
SNARE SNG USE RND 15MM (INSTRUMENTS) IMPLANT
SPOT EX ENDOSCOPIC TATTOO (MISCELLANEOUS)
TRAP ETRAP POLY (MISCELLANEOUS) ×1 IMPLANT
VARIJECT INJECTOR VIN23 (MISCELLANEOUS)
WATER STERILE IRR 250ML POUR (IV SOLUTION) ×3 IMPLANT

## 2017-06-03 NOTE — Anesthesia Procedure Notes (Signed)
Performed by: Carroll Lingelbach, CRNA Pre-anesthesia Checklist: Patient identified, Emergency Drugs available, Suction available, Timeout performed and Patient being monitored Patient Re-evaluated:Patient Re-evaluated prior to induction Oxygen Delivery Method: Nasal cannula Placement Confirmation: positive ETCO2       

## 2017-06-03 NOTE — Op Note (Signed)
[facility] Gastroenterology Patient Name: Peter Sanford Procedure Date: 06/03/2017 9:58 AM MRN: [id number] Account #: [id number] Date of Birth: 09/09/1965 Admit Type: Outpatient Age: 51 Room: [facility] OR ROOM 01 Gender: Male Note Status: Finalized Procedure:            Colonoscopy Indications:          Screening for colorectal malignant neoplasm Providers:            Lucilla Lame MD, MD Referring MD:         Maud Deed. Copland MD, MD (Referring MD) Medicines:            Propofol per Anesthesia Complications:        No immediate complications. Procedure:            Pre-Anesthesia Assessment:                       - Prior to the procedure, a History and Physical was                        performed, and patient medications and allergies were                        reviewed. The patient's tolerance of previous                        anesthesia was also reviewed. The risks and benefits of                        the procedure and the sedation options and risks were                        discussed with the patient. All questions were                        answered, and informed consent was obtained. Prior                        Anticoagulants: The patient has taken no previous                        anticoagulant or antiplatelet agents. ASA Grade                        Assessment: II - A patient with mild systemic disease.                        After reviewing the risks and benefits, the patient was                        deemed in satisfactory condition to undergo the                        procedure.                       After obtaining informed consent, the colonoscope was                        passed under direct vision. Throughout the procedure,  the patient's blood pressure, pulse, and oxygen                        saturations were monitored continuously. The [place] [phone]) was introduced  through the                        anus and advanced to the the cecum, identified by                        appendiceal orifice and ileocecal valve. The                        colonoscopy was performed without difficulty. The                        patient tolerated the procedure well. The quality of                        the bowel preparation was excellent. Findings:      The perianal and digital rectal examinations were normal.      A 5 mm polyp was found in the recto-sigmoid colon. The polyp was       sessile. The polyp was removed with a cold snare. Resection and       retrieval were complete.      Non-bleeding internal hemorrhoids were found during retroflexion. The       hemorrhoids were Grade I (internal hemorrhoids that do not prolapse). Impression:           - One 5 mm polyp at the recto-sigmoid colon, removed                        with a cold snare. Resected and retrieved.                       - Non-bleeding internal hemorrhoids. Recommendation:       - Discharge patient to home.                       - Resume previous diet.                       - Continue present medications.                       - Await pathology results.                       - Repeat colonoscopy in 5 years if polyp adenoma and 10                        years if hyperplastic Procedure Code(s):    --- Professional ---                       [phone], Colonoscopy, flexible; with removal of tumor(s),                        polyp(s), or other lesion(s) by snare technique Diagnosis Code(s):    ---  Professional ---                       Z12.11, Encounter for screening for malignant neoplasm                        of colon                       D12.7, Benign neoplasm of rectosigmoid junction CPT copyright 2016 American Medical Association. All rights reserved. The codes documented in this report are preliminary and upon coder review may  be revised to meet current compliance requirements. Lucilla Lame MD,  MD 06/03/2017 10:22:32 AM This report has been signed electronically. Number of Addenda: 0 Note Initiated On: 06/03/2017 9:58 AM Scope Withdrawal Time: 0 hours 7 minutes 48 seconds  Total Procedure Duration: 0 hours 12 minutes 12 seconds       [facility]

## 2017-06-03 NOTE — Anesthesia Preprocedure Evaluation (Signed)
Anesthesia Evaluation  Patient identified by MRN, date of birth, ID band Patient awake    Reviewed: Allergy & Precautions, H&P , NPO status , Patient's Chart, lab work & pertinent test results  Airway Mallampati: II  TM Distance: >3 FB Neck ROM: full    Dental no notable dental hx.    Pulmonary    Pulmonary exam normal breath sounds clear to auscultation       Cardiovascular Normal cardiovascular exam Rhythm:regular Rate:Normal     Neuro/Psych PSYCHIATRIC DISORDERS    GI/Hepatic   Endo/Other    Renal/GU      Musculoskeletal   Abdominal   Peds  Hematology   Anesthesia Other Findings   Reproductive/Obstetrics                             Anesthesia Physical Anesthesia Plan  ASA: II  Anesthesia Plan: General   Post-op Pain Management:    Induction: Intravenous  PONV Risk Score and Plan: 2 and Propofol infusion  Airway Management Planned: Natural Airway  Additional Equipment:   Intra-op Plan:   Post-operative Plan:   Informed Consent: I have reviewed the patients History and Physical, chart, labs and discussed the procedure including the risks, benefits and alternatives for the proposed anesthesia with the patient or authorized representative who has indicated his/her understanding and acceptance.     Plan Discussed with: CRNA  Anesthesia Plan Comments:         Anesthesia Quick Evaluation

## 2017-06-03 NOTE — H&P (Signed)
   Lucilla Lame, MD [address] [place], [place] [address] Phone: [phone] Fax : [phone]  Primary Care Physician:  Owens Loffler, MD Primary Gastroenterologist:  Dr. Allen Norris  Pre-Procedure History & Physical: HPI:  Peter Sanford is a 51 y.o. male is here for a screening colonoscopy.   Past Medical History:  Diagnosis Date  . GAD (generalized anxiety disorder)   . History of nephrolithiasis   . Pollen allergies   . Wears contact lenses     Past Surgical History:  Procedure Laterality Date  . WISDOM TOOTH EXTRACTION      Prior to Admission medications   Medication Sig Start Date End Date Taking? Authorizing Provider  citalopram (CELEXA) 20 MG tablet Take 1 tablet (20 mg total) by mouth daily. 04/25/17  Yes Copland, Frederico Hamman, MD  Loratadine (CLARITIN) 10 MG CAPS Take 1 capsule by mouth daily as needed.     Yes [provider]  Multiple Vitamin (MULTIVITAMIN) tablet Take 1 tablet by mouth daily.   Yes [provider]  Omega-3 Fatty Acids (OMEGA 3 PO) Take by mouth daily.   Yes [provider]    Allergies as of 05/13/2017  . (No Known Allergies)    History reviewed. No pertinent family history.  Social History   Socioeconomic History  . Marital status: Married    Spouse name: Not on file  . Number of children: Not on file  . Years of education: Not on file  . Highest education level: Not on file  Social Needs  . Financial resource strain: Not on file  . Food insecurity - worry: Not on file  . Food insecurity - inability: Not on file  . Transportation needs - medical: Not on file  . Transportation needs - non-medical: Not on file  Occupational History  . Not on file  Tobacco Use  . Smoking status: Never Smoker  . Smokeless tobacco: Never Used  Substance and Sexual Activity  . Alcohol use: Yes    Alcohol/week: 1.8 oz    Types: 3 Cans of beer per week    Comment: occ  . Drug use: No  . Sexual activity:  Not on file  Other Topics Concern  . Not on file  Social History Narrative  . Not on file    Review of Systems: See HPI, otherwise negative ROS  Physical Exam: BP 119/82   Pulse (!) 50   Temp (!) 97.3 F (36.3 C) (Temporal)   Resp 16   Ht 5' 7.5" (1.715 m)   Wt 182 lb (82.6 kg)   SpO2 100%   BMI 28.08 kg/m  General:   Alert,  pleasant and cooperative in NAD Head:  Normocephalic and atraumatic. Neck:  Supple; no masses or thyromegaly. Lungs:  Clear throughout to auscultation.    Heart:  Regular rate and rhythm. Abdomen:  Soft, nontender and nondistended. Normal bowel sounds, without guarding, and without rebound.   Neurologic:  Alert and  oriented x4;  grossly normal neurologically.  Impression/Plan: Peter Sanford is now here to undergo a screening colonoscopy.  Risks, benefits, and alternatives regarding colonoscopy have been reviewed with the patient.  Questions have been answered.  All parties agreeable.

## 2017-06-03 NOTE — Transfer of Care (Signed)
Immediate Anesthesia Transfer of Care Note  Patient: Peter Sanford  Procedure(s) Performed: COLONOSCOPY WITH PROPOFOL (N/A )  Patient Location: PACU  Anesthesia Type: General  Level of Consciousness: awake, alert  and patient cooperative  Airway and Oxygen Therapy: Patient Spontanous Breathing and Patient connected to supplemental oxygen  Post-op Assessment: Post-op Vital signs reviewed, Patient's Cardiovascular Status Stable, Respiratory Function Stable, Patent Airway and No signs of Nausea or vomiting  Post-op Vital Signs: Reviewed and stable  Complications: No apparent anesthesia complications

## 2017-06-03 NOTE — Anesthesia Postprocedure Evaluation (Signed)
Anesthesia Post Note  Patient: Peter Sanford  Procedure(s) Performed: COLONOSCOPY WITH PROPOFOL (N/A ) POLYPECTOMY  Patient location during evaluation: PACU Anesthesia Type: General Level of consciousness: awake and alert and oriented Pain management: satisfactory to patient Vital Signs Assessment: post-procedure vital signs reviewed and stable Respiratory status: spontaneous breathing, nonlabored ventilation and respiratory function stable Cardiovascular status: blood pressure returned to baseline and stable Postop Assessment: Adequate PO intake and No signs of nausea or vomiting Anesthetic complications: no    Raliegh Ip

## 2017-06-04 ENCOUNTER — Encounter: Payer: Self-pay | Admitting: Gastroenterology

## 2017-06-07 ENCOUNTER — Encounter: Payer: Self-pay | Admitting: Gastroenterology

## 2017-06-08 ENCOUNTER — Encounter: Payer: Self-pay | Admitting: Gastroenterology

## 2017-10-25 ENCOUNTER — Other Ambulatory Visit: Payer: Self-pay | Admitting: Family Medicine

## 2018-05-02 ENCOUNTER — Other Ambulatory Visit: Payer: Self-pay | Admitting: Family Medicine

## 2018-05-02 NOTE — Telephone Encounter (Signed)
Pt called to ck on refill for citalopram and I advised pt refill for citalopram 20 mg was sent electronically to [facility] [address] already. Pt voiced understanding and will pick up med on 05/03/18.

## 2018-06-12 ENCOUNTER — Other Ambulatory Visit: Payer: Self-pay | Admitting: Family Medicine

## 2018-06-12 DIAGNOSIS — Z0000 Encounter for general adult medical examination without abnormal findings: Secondary | ICD-10-CM

## 2018-06-19 ENCOUNTER — Other Ambulatory Visit (INDEPENDENT_AMBULATORY_CARE_PROVIDER_SITE_OTHER): Payer: BC Managed Care – PPO

## 2018-06-19 DIAGNOSIS — Z0000 Encounter for general adult medical examination without abnormal findings: Secondary | ICD-10-CM | POA: Diagnosis not present

## 2018-06-19 LAB — HEPATIC FUNCTION PANEL
ALBUMIN: 4.4 g/dL (ref 3.5–5.2)
ALK PHOS: 47 U/L (ref 39–117)
ALT: 19 U/L (ref 0–53)
AST: 17 U/L (ref 0–37)
BILIRUBIN TOTAL: 0.8 mg/dL (ref 0.2–1.2)
Bilirubin, Direct: 0.1 mg/dL (ref 0.0–0.3)
Total Protein: 7 g/dL (ref 6.0–8.3)

## 2018-06-19 LAB — CBC WITH DIFFERENTIAL/PLATELET
BASOS PCT: 0.8 % (ref 0.0–3.0)
Basophils Absolute: 0 10*3/uL (ref 0.0–0.1)
EOS ABS: 0.2 10*3/uL (ref 0.0–0.7)
Eosinophils Relative: 5.3 % — ABNORMAL HIGH (ref 0.0–5.0)
HCT: 47.6 % (ref 39.0–52.0)
HEMOGLOBIN: 16.3 g/dL (ref 13.0–17.0)
Lymphocytes Relative: 38.1 % (ref 12.0–46.0)
Lymphs Abs: 1.7 10*3/uL (ref 0.7–4.0)
MCHC: 34.2 g/dL (ref 30.0–36.0)
MCV: 93.6 fl (ref 78.0–100.0)
Monocytes Absolute: 0.4 10*3/uL (ref 0.1–1.0)
Monocytes Relative: 8.2 % (ref 3.0–12.0)
Neutro Abs: 2.1 10*3/uL (ref 1.4–7.7)
Neutrophils Relative %: 47.6 % (ref 43.0–77.0)
Platelets: 242 10*3/uL (ref 150.0–400.0)
RBC: 5.08 Mil/uL (ref 4.22–5.81)
RDW: 12.7 % (ref 11.5–15.5)
WBC: 4.4 10*3/uL (ref 4.0–10.5)

## 2018-06-19 LAB — BASIC METABOLIC PANEL
BUN: 18 mg/dL (ref 6–23)
CO2: 29 mEq/L (ref 19–32)
Calcium: 9.7 mg/dL (ref 8.4–10.5)
Chloride: 102 mEq/L (ref 96–112)
Creatinine, Ser: 1.09 mg/dL (ref 0.40–1.50)
GFR: 75.52 mL/min (ref >60.00)
GLUCOSE: 110 mg/dL — AB (ref 70–99)
POTASSIUM: 4.7 meq/L (ref 3.5–5.1)
SODIUM: 138 meq/L (ref 135–145)

## 2018-06-19 LAB — LIPID PANEL
Cholesterol: 218 mg/dL — ABNORMAL HIGH (ref 0–200)
HDL: 48.5 mg/dL (ref >39.00)
LDL CALC: 144 mg/dL — AB (ref 0–99)
NONHDL: 169.57
Total CHOL/HDL Ratio: 4
Triglycerides: 127 mg/dL (ref 0.0–149.0)
VLDL: 25.4 mg/dL (ref 0.0–40.0)

## 2018-06-19 LAB — HEMOGLOBIN A1C: HEMOGLOBIN A1C: 6.2 % (ref 4.6–6.5)

## 2018-06-19 LAB — PSA: PSA: 0.69 ng/mL (ref 0.10–4.00)

## 2018-06-25 NOTE — Progress Notes (Signed)
Dr. Frederico Hamman T. Laurian Edrington, MD, [facility] and Sports Medicine [place], [address] Phone: [phone] Fax: [phone]  06/26/2018  Patient: Peter Sanford, MRN: [id number], DOB: Feb 13, 1966, 52 y.o.  Primary Physician:  Owens Loffler, MD   Chief Complaint  Patient presents with  . Annual Exam   Subjective:   NAZAR KUAN is a 52 y.o. pleasant patient who presents with the following:  Preventative Health Maintenance Visit:  Health Maintenance Summary Reviewed and updated, unless pt declines services.  Tobacco History Reviewed. Alcohol: No concerns, no excessive use Exercise Habits: Running 18-20 miles a week STD concerns: no risk or activity to increase risk Drug Use: None Encouraged self-testicular check  Had flu shot this year  Health Maintenance  Topic Date Due  . HIV Screening  07/09/1981  . INFLUENZA VACCINE  01/26/2018  . COLONOSCOPY  06/03/2022  . TETANUS/TDAP  06/12/2023   Immunization History  Administered Date(s) Administered  . Influenza Nasal 05/14/2015  . Influenza Split 03/16/2012  . Influenza,inj,Quad PF,6+ Mos 04/15/2017  . Tdap 06/11/2013   Patient Active Problem List   Diagnosis Date Noted  . Benign neoplasm of rectosigmoid junction   . Prediabetes 02/11/2016  . Generalized anxiety disorder 02/02/2011  . 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  . ALLERGIC RHINITIS 11/22/2006  . NEPHROLITHIASIS, HX OF 11/22/2006   Past Medical History:  Diagnosis Date  . GAD (generalized anxiety disorder)   . History of nephrolithiasis   . Pollen allergies   . Wears contact lenses    Past Surgical History:  Procedure Laterality Date  . COLONOSCOPY WITH PROPOFOL N/A 06/03/2017   Procedure: COLONOSCOPY WITH PROPOFOL;  Surgeon: Lucilla Lame, MD;  Location: [facility];  Service: Endoscopy;  Laterality: N/A;  . POLYPECTOMY  06/03/2017   Procedure: POLYPECTOMY;  Surgeon: Lucilla Lame, MD;  Location:  [facility];  Service: Endoscopy;;  . WISDOM TOOTH EXTRACTION     Social History   Socioeconomic History  . Marital status: Married    Spouse name: Not on file  . Number of children: Not on file  . Years of education: Not on file  . Highest education level: Not on file  Occupational History  . Not on file  Social Needs  . Financial resource strain: Not on file  . Food insecurity:    Worry: Not on file    Inability: Not on file  . Transportation needs:    Medical: Not on file    Non-medical: Not on file  Tobacco Use  . Smoking status: Never Smoker  . Smokeless tobacco: Never Used  Substance and Sexual Activity  . Alcohol use: Yes    Alcohol/week: 3.0 standard drinks    Types: 3 Cans of beer per week    Comment: occ  . Drug use: No  . Sexual activity: Not on file  Lifestyle  . Physical activity:    Days per week: Not on file    Minutes per session: Not on file  . Stress: Not on file  Relationships  . Social connections:    Talks on phone: Not on file    Gets together: Not on file    Attends religious service: Not on file    Active member of club or organization: Not on file    Attends meetings of clubs or organizations: Not on file    Relationship status: Not on file  . Intimate partner violence:    Fear of current or ex partner:  Not on file    Emotionally abused: Not on file    Physically abused: Not on file    Forced sexual activity: Not on file  Other Topics Concern  . Not on file  Social History Narrative  . Not on file   History reviewed. No pertinent family history. No Known Allergies  Medication list has been reviewed and updated.   General: Denies fever, chills, sweats. No significant weight loss. Eyes: Denies blurring,significant itching ENT: Denies earache, sore throat, and hoarseness. Cardiovascular: Denies chest pains, palpitations, dyspnea on exertion Respiratory: Denies cough, dyspnea at rest,wheeezing Breast: no concerns about  lumps GI: Denies nausea, vomiting, diarrhea, constipation, change in bowel habits, abdominal pain, melena, hematochezia GU: Denies penile discharge, ED, urinary flow / outflow problems. No STD concerns. Musculoskeletal: Denies back pain, joint pain Derm: Denies rash, itching Neuro: Denies  paresthesias, frequent falls, frequent headaches Psych: Denies depression, anxiety Endocrine: Denies cold intolerance, heat intolerance, polydipsia Heme: Denies enlarged lymph nodes Allergy: No hayfever  Objective:   BP 126/70   Pulse (!) 55   Temp 98.3 F (36.8 C) (Oral)   Ht 5' 5.75" (1.67 m)   Wt 186 lb 8 oz (84.6 kg)   BMI 30.33 kg/m  Ideal Body Weight: Weight in (lb) to have BMI = 25: 153.4  No exam data present  GEN: well developed, well nourished, no acute distress Eyes: conjunctiva and lids normal, PERRLA, EOMI ENT: TM clear, nares clear, oral exam WNL Neck: supple, no lymphadenopathy, no thyromegaly, no JVD Pulm: clear to auscultation and percussion, respiratory effort normal CV: regular rate and rhythm, S1-S2, no murmur, rub or gallop, no bruits, peripheral pulses normal and symmetric, no cyanosis, clubbing, edema or varicosities GI: soft, non-tender; no hepatosplenomegaly, masses; active bowel sounds all quadrants GU: no hernia, testicular mass, penile discharge Lymph: no cervical, axillary or inguinal adenopathy MSK: gait normal, muscle tone and strength WNL, no joint swelling, effusions, discoloration, crepitus  SKIN: clear, good turgor, color WNL, no rashes, lesions, or ulcerations Neuro: normal mental status, normal strength, sensation, and motion Psych: alert; oriented to person, place and time, normally interactive and not anxious or depressed in appearance. All labs reviewed with patient.  Lipids: Lab Results  Component Value Date   CHOL 218 (H) 06/19/2018   Lab Results  Component Value Date   HDL 48.50 06/19/2018   Lab Results  Component Value Date   LDLCALC 144  (H) 06/19/2018   Lab Results  Component Value Date   TRIG 127.0 06/19/2018   Lab Results  Component Value Date   CHOLHDL 4 06/19/2018   CBC: CBC Latest Ref Rng & Units 06/19/2018 04/25/2017 02/04/2016  WBC 4.0 - 10.5 K/uL 4.4 4.8 5.2  Hemoglobin 13.0 - 17.0 g/dL 16.3 14.9 15.5  Hematocrit 39.0 - 52.0 % 47.6 44.9 45.3  Platelets 150.0 - 400.0 K/uL 242.0 214.0 937.9    Basic Metabolic Panel:    Component Value Date/Time   NA 138 06/19/2018 1136   K 4.7 06/19/2018 1136   CL 102 06/19/2018 1136   CO2 29 06/19/2018 1136   BUN 18 06/19/2018 1136   CREATININE 1.09 06/19/2018 1136   GLUCOSE 110 (H) 06/19/2018 1136   CALCIUM 9.7 06/19/2018 1136   Hepatic Function Latest Ref Rng & Units 06/19/2018 04/25/2017 02/04/2016  Total Protein 6.0 - 8.3 g/dL 7.0 6.5 7.1  Albumin 3.5 - 5.2 g/dL 4.4 4.1 4.1  AST 0 - 37 U/L _0 ALT 0 - 53 U/L 19  19 25  Alk Phosphatase 39 - 117 U/L 47 48 46  Total Bilirubin 0.2 - 1.2 mg/dL 0.8 0.6 0.4  Bilirubin, Direct 0.0 - 0.3 mg/dL 0.1 0.1 0.1    No results found for: TSH Lab Results  Component Value Date   PSA 0.69 06/19/2018   PSA 0.74 04/25/2017    Assessment and Plan:   Routine general medical examination at a health care facility  Doing well overall  Health Maintenance Exam: The patient's preventative maintenance and recommended screening tests for an annual wellness exam were reviewed in full today. Brought up to date unless services declined.  Counselled on the importance of diet, exercise, and its role in overall health and mortality. The patient's FH and SH was reviewed, including their home life, tobacco status, and drug and alcohol status.  Follow-up in 1 year for physical exam or additional follow-up below.  Follow-up: No follow-ups on file. Or follow-up in 1 year if not noted.  Signed,  Maud Deed. Kendalyn Cranfield, MD   Allergies as of 06/26/2018   No Known Allergies     Medication List       Accurate as of June 26, 2018  3:07 PM. Always use your most recent med list.        citalopram 20 MG tablet Commonly known as:  CELEXA TAKE 1 TABLET BY MOUTH EVERY DAY   CLARITIN 10 MG Caps Generic drug:  Loratadine Take 1 capsule by mouth daily as needed.   multivitamin tablet Take 1 tablet by mouth daily.   OMEGA 3 PO Take by mouth daily.

## 2018-06-26 ENCOUNTER — Encounter: Payer: Self-pay | Admitting: Family Medicine

## 2018-06-26 ENCOUNTER — Ambulatory Visit (INDEPENDENT_AMBULATORY_CARE_PROVIDER_SITE_OTHER): Payer: BC Managed Care – PPO | Admitting: Family Medicine

## 2018-06-26 VITALS — BP 126/70 | HR 55 | Temp 98.3°F | Ht 65.75 in | Wt 186.5 lb

## 2018-06-26 DIAGNOSIS — Z0000 Encounter for general adult medical examination without abnormal findings: Secondary | ICD-10-CM

## 2018-07-24 ENCOUNTER — Other Ambulatory Visit: Payer: Self-pay | Admitting: Family Medicine

## 2019-01-20 ENCOUNTER — Other Ambulatory Visit: Payer: Self-pay | Admitting: Family Medicine

## 2019-07-04 ENCOUNTER — Ambulatory Visit (INDEPENDENT_AMBULATORY_CARE_PROVIDER_SITE_OTHER): Payer: BC Managed Care – PPO | Admitting: Family Medicine

## 2019-07-04 ENCOUNTER — Other Ambulatory Visit: Payer: Self-pay

## 2019-07-04 ENCOUNTER — Encounter: Payer: Self-pay | Admitting: Family Medicine

## 2019-07-04 VITALS — BP 118/72 | HR 63 | Temp 98.2°F | Ht 67.5 in | Wt 185.0 lb

## 2019-07-04 DIAGNOSIS — Z0000 Encounter for general adult medical examination without abnormal findings: Secondary | ICD-10-CM

## 2019-07-04 NOTE — Progress Notes (Signed)
Peter Matsuo T. Taquisha Phung, MD Primary Care and [place] at [facility] [place], [address] Phone: [phone]  FAX: [place] - 54 y.o. male  MRN [id number]  Date of Birth: 05-Apr-1966  Visit Date: 07/04/2019  PCP: Owens Loffler, MD  Referred by: Owens Loffler, MD  Chief Complaint  Patient presents with  . Annual Exam   This visit occurred during the SARS-CoV-2 public health emergency.  Safety protocols were in place, including screening questions prior to the visit, additional usage of staff PPE, and extensive cleaning of exam room while observing appropriate contact time as indicated for disinfecting solutions.   Patient Care Team: Owens Loffler, MD as PCP - General (Family Medicine) Subjective:   Peter Sanford is a 54 y.o. pleasant patient who presents with the following:  Preventative Health Maintenance Visit:  Health Maintenance Summary Reviewed and updated, unless pt declines services.  Tobacco History Reviewed. Alcohol: No concerns, no excessive use Exercise Habits: Some activity, rec at least 30 mins 5 times a week - runs 3.2 every day, has an erg at home STD concerns: no risk or activity to increase risk Drug Use: None Encouraged self-testicular check  No other issues. Doing well, still running daily Some erg when weather is bad.  Health Maintenance  Topic Date Due  . HIV Screening  07/09/1981  . COLONOSCOPY  06/03/2022  . TETANUS/TDAP  06/12/2023  . INFLUENZA VACCINE  Completed   Immunization History  Administered Date(s) Administered  . Influenza Nasal 05/14/2015  . Influenza Split 03/16/2012  . Influenza, Seasonal, Injecte, Preservative Fre 04/01/2019  . Influenza,inj,Quad PF,6+ Mos 04/15/2017, 03/24/2018  . Tdap 06/11/2013   Patient Active Problem List   Diagnosis Date Noted  . Benign neoplasm of rectosigmoid junction   . Prediabetes 02/11/2016  .  Generalized anxiety disorder 02/02/2011  . 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  . ALLERGIC RHINITIS 11/22/2006  . NEPHROLITHIASIS, HX OF 11/22/2006   Past Medical History:  Diagnosis Date  . GAD (generalized anxiety disorder)   . History of nephrolithiasis   . Pollen allergies   . Wears contact lenses    Past Surgical History:  Procedure Laterality Date  . COLONOSCOPY WITH PROPOFOL N/A 06/03/2017   Procedure: COLONOSCOPY WITH PROPOFOL;  Surgeon: Lucilla Lame, MD;  Location: [facility];  Service: Endoscopy;  Laterality: N/A;  . POLYPECTOMY  06/03/2017   Procedure: POLYPECTOMY;  Surgeon: Lucilla Lame, MD;  Location: [facility];  Service: Endoscopy;;  . WISDOM TOOTH EXTRACTION     Social History   Socioeconomic History  . Marital status: Married    Spouse name: Not on file  . Number of children: Not on file  . Years of education: Not on file  . Highest education level: Not on file  Occupational History  . Not on file  Tobacco Use  . Smoking status: Never Smoker  . Smokeless tobacco: Never Used  Substance and Sexual Activity  . Alcohol use: Yes    Alcohol/week: 3.0 standard drinks    Types: 3 Cans of beer per week    Comment: occ  . Drug use: No  . Sexual activity: Not on file  Other Topics Concern  . Not on file  Social History Narrative  . Not on file   Social Determinants of Health   Financial Resource Strain:   . Difficulty of Paying Living Expenses: Not on file  Food Insecurity:   . Worried About Estate manager/land agent  of Food in the Last Year: Not on file  . Ran Out of Food in the Last Year: Not on file  Transportation Needs:   . Lack of Transportation (Medical): Not on file  . Lack of Transportation (Non-Medical): Not on file  Physical Activity:   . Days of Exercise per Week: Not on file  . Minutes of Exercise per Session: Not on file  Stress:   . Feeling of Stress : Not on file  Social Connections:   . Frequency of Communication with Friends and Family:  Not on file  . Frequency of Social Gatherings with Friends and Family: Not on file  . Attends Religious Services: Not on file  . Active Member of Clubs or Organizations: Not on file  . Attends Archivist Meetings: Not on file  . Marital Status: Not on file  Intimate Partner Violence:   . Fear of Current or Ex-Partner: Not on file  . Emotionally Abused: Not on file  . Physically Abused: Not on file  . Sexually Abused: Not on file   History reviewed. No pertinent family history. No Known Allergies  Medication list has been reviewed and updated.   General: Denies fever, chills, sweats. No significant weight loss. Eyes: Denies blurring,significant itching ENT: Denies earache, sore throat, and hoarseness. Cardiovascular: Denies chest pains, palpitations, dyspnea on exertion Respiratory: Denies cough, dyspnea at rest,wheeezing Breast: no concerns about lumps GI: Denies nausea, vomiting, diarrhea, constipation, change in bowel habits, abdominal pain, melena, hematochezia GU: Denies penile discharge, ED, urinary flow / outflow problems. No STD concerns. Musculoskeletal: Denies back pain, joint pain Derm: Denies rash, itching Neuro: Denies  paresthesias, frequent falls, frequent headaches Psych: Denies depression, anxiety Endocrine: Denies cold intolerance, heat intolerance, polydipsia Heme: Denies enlarged lymph nodes Allergy: No hayfever  Objective:   BP 118/72   Pulse 63   Temp 98.2 F (36.8 C) (Temporal)   Ht 5' 7.5" (1.715 m)   Wt 185 lb (83.9 kg)   SpO2 98%   BMI 28.55 kg/m  Ideal Body Weight: Weight in (lb) to have BMI = 25: 161.7  Ideal Body Weight: Weight in (lb) to have BMI = 25: 161.7 No exam data present Depression screen [facility] 2/9 07/04/2019 06/26/2018  Decreased Interest 0 0  Down, Depressed, Hopeless 0 0  PHQ - 2 Score 0 0     GEN: well developed, well nourished, no acute distress Eyes: conjunctiva and lids normal, PERRLA, EOMI ENT: TM clear,  nares clear, oral exam WNL Neck: supple, no lymphadenopathy, no thyromegaly, no JVD Pulm: clear to auscultation and percussion, respiratory effort normal CV: regular rate and rhythm, S1-S2, no murmur, rub or gallop, no bruits, peripheral pulses normal and symmetric, no cyanosis, clubbing, edema or varicosities GI: soft, non-tender; no hepatosplenomegaly, masses; active bowel sounds all quadrants GU: no hernia, testicular mass, penile discharge Lymph: no cervical, axillary or inguinal adenopathy MSK: gait normal, muscle tone and strength WNL, no joint swelling, effusions, discoloration, crepitus  SKIN: clear, good turgor, color WNL, no rashes, lesions, or ulcerations Neuro: normal mental status, normal strength, sensation, and motion Psych: alert; oriented to person, place and time, normally interactive and not anxious or depressed in appearance.  All labs reviewed with patient. Results for orders placed or performed in visit on 06/19/18  PSA  Result Value Ref Range   PSA 0.69 0.10 - 4.00 ng/mL  Hemoglobin A1c  Result Value Ref Range   Hgb A1c MFr Bld 6.2 4.6 - 6.5 %  CBC with  Differential/Platelet  Result Value Ref Range   WBC 4.4 4.0 - 10.5 K/uL   RBC 5.08 4.22 - 5.81 Mil/uL   Hemoglobin 16.3 13.0 - 17.0 g/dL   HCT 47.6 39.0 - 52.0 %   MCV 93.6 78.0 - 100.0 fl   MCHC 34.2 30.0 - 36.0 g/dL   RDW 12.7 11.5 - 15.5 %   Platelets 242.0 150.0 - 400.0 K/uL   Neutrophils Relative % 47.6 43.0 - 77.0 %   Lymphocytes Relative 38.1 12.0 - 46.0 %   Monocytes Relative 8.2 3.0 - 12.0 %   Eosinophils Relative 5.3 (H) 0.0 - 5.0 %   Basophils Relative 0.8 0.0 - 3.0 %   Neutro Abs 2.1 1.4 - 7.7 K/uL   Lymphs Abs 1.7 0.7 - 4.0 K/uL   Monocytes Absolute 0.4 0.1 - 1.0 K/uL   Eosinophils Absolute 0.2 0.0 - 0.7 K/uL   Basophils Absolute 0.0 0.0 - 0.1 K/uL  Basic metabolic panel  Result Value Ref Range   Sodium 138 135 - 145 mEq/L   Potassium 4.7 3.5 - 5.1 mEq/L   Chloride 102 96 - 112 mEq/L    CO2 29 19 - 32 mEq/L   Glucose, Bld 110 (H) 70 - 99 mg/dL   BUN 18 6 - 23 mg/dL   Creatinine, Ser 1.09 0.40 - 1.50 mg/dL   Calcium 9.7 8.4 - 10.5 mg/dL   GFR 75.52 >60.00 mL/min  Hepatic function panel  Result Value Ref Range   Total Bilirubin 0.8 0.2 - 1.2 mg/dL   Bilirubin, Direct 0.1 0.0 - 0.3 mg/dL   Alkaline Phosphatase 47 39 - 117 U/L   AST 17 0 - 37 U/L   ALT 19 0 - 53 U/L   Total Protein 7.0 6.0 - 8.3 g/dL   Albumin 4.4 3.5 - 5.2 g/dL  Lipid panel  Result Value Ref Range   Cholesterol 218 (H) 0 - 200 mg/dL   Triglycerides 127.0 0.0 - 149.0 mg/dL   HDL 48.50 >39.00 mg/dL   VLDL 25.4 0.0 - 40.0 mg/dL   LDL Cholesterol 144 (H) 0 - 99 mg/dL   Total CHOL/HDL Ratio 4    NonHDL 169.57     Assessment and Plan:     ICD-10-CM   1. Healthcare maintenance  123456 Basic metabolic panel    CBC with Differential    Hepatic function panel    PSA: Total, Reflex to Free   Check all labs today - he did eat lunch so hold on FLP  Health Maintenance Exam: The patient's preventative maintenance and recommended screening tests for an annual wellness exam were reviewed in full today. Brought up to date unless services declined.  Counselled on the importance of diet, exercise, and its role in overall health and mortality. The patient's FH and SH was reviewed, including their home life, tobacco status, and drug and alcohol status.  Follow-up in 1 year for physical exam or additional follow-up below.  Follow-up: No follow-ups on file. Or follow-up in 1 year if not noted.  No orders of the defined types were placed in this encounter.  There are no discontinued medications. Orders Placed This Encounter  Procedures  . Basic metabolic panel  . CBC with Differential  . Hepatic function panel  . PSA: Total, Reflex to [employer],  Joleen Stuckert T. Lynden Carrithers, MD   Allergies as of 07/04/2019   No Known Allergies     Medication List       Accurate as  of July 04, 2019  2:26 PM. If  you have any questions, ask your nurse or doctor.        citalopram 20 MG tablet Commonly known as: CELEXA TAKE 1 TABLET BY MOUTH EVERY DAY   Claritin 10 MG Caps Generic drug: Loratadine Take 1 capsule by mouth daily as needed.   multivitamin tablet Take 1 tablet by mouth daily.   OMEGA 3 PO Take by mouth daily.

## 2019-07-05 LAB — BASIC METABOLIC PANEL
BUN: 18 mg/dL (ref 6–23)
CO2: 31 mEq/L (ref 19–32)
Calcium: 9.6 mg/dL (ref 8.4–10.5)
Chloride: 102 mEq/L (ref 96–112)
Creatinine, Ser: 1.31 mg/dL (ref 0.40–1.50)
GFR: 57.24 mL/min — ABNORMAL LOW (ref >60.00)
Glucose, Bld: 67 mg/dL — ABNORMAL LOW (ref 70–99)
Potassium: 4.4 mEq/L (ref 3.5–5.1)
Sodium: 139 mEq/L (ref 135–145)

## 2019-07-05 LAB — CBC WITH DIFFERENTIAL/PLATELET
Basophils Absolute: 0.1 10*3/uL (ref 0.0–0.1)
Basophils Relative: 1.1 % (ref 0.0–3.0)
Eosinophils Absolute: 0.3 10*3/uL (ref 0.0–0.7)
Eosinophils Relative: 4.7 % (ref 0.0–5.0)
HCT: 47.3 % (ref 39.0–52.0)
Hemoglobin: 15.8 g/dL (ref 13.0–17.0)
Lymphocytes Relative: 32.1 % (ref 12.0–46.0)
Lymphs Abs: 1.8 10*3/uL (ref 0.7–4.0)
MCHC: 33.3 g/dL (ref 30.0–36.0)
MCV: 94.8 fl (ref 78.0–100.0)
Monocytes Absolute: 0.5 10*3/uL (ref 0.1–1.0)
Monocytes Relative: 8.2 % (ref 3.0–12.0)
Neutro Abs: 3 10*3/uL (ref 1.4–7.7)
Neutrophils Relative %: 53.9 % (ref 43.0–77.0)
Platelets: 253 10*3/uL (ref 150.0–400.0)
RBC: 4.99 Mil/uL (ref 4.22–5.81)
RDW: 13.1 % (ref 11.5–15.5)
WBC: 5.5 10*3/uL (ref 4.0–10.5)

## 2019-07-05 LAB — PSA, TOTAL WITH REFLEX TO PSA, FREE: PSA, Total: 0.6 ng/mL (ref <=4.0)

## 2019-07-05 LAB — HEPATIC FUNCTION PANEL
ALT: 21 U/L (ref 0–53)
AST: 21 U/L (ref 0–37)
Albumin: 4.4 g/dL (ref 3.5–5.2)
Alkaline Phosphatase: 45 U/L (ref 39–117)
Bilirubin, Direct: 0.1 mg/dL (ref 0.0–0.3)
Total Bilirubin: 0.7 mg/dL (ref 0.2–1.2)
Total Protein: 7.2 g/dL (ref 6.0–8.3)

## 2019-07-15 ENCOUNTER — Other Ambulatory Visit: Payer: Self-pay | Admitting: Family Medicine

## 2020-01-22 ENCOUNTER — Other Ambulatory Visit: Payer: Self-pay | Admitting: Family Medicine

## 2020-02-18 ENCOUNTER — Encounter: Payer: Self-pay | Admitting: Family Medicine

## 2020-02-18 ENCOUNTER — Other Ambulatory Visit: Payer: Self-pay

## 2020-02-18 ENCOUNTER — Ambulatory Visit: Payer: BC Managed Care – PPO | Admitting: Family Medicine

## 2020-02-18 ENCOUNTER — Ambulatory Visit: Payer: Self-pay

## 2020-02-18 VITALS — BP 124/74 | HR 60 | Ht 67.5 in | Wt 176.0 lb

## 2020-02-18 DIAGNOSIS — M25561 Pain in right knee: Secondary | ICD-10-CM | POA: Diagnosis not present

## 2020-02-18 DIAGNOSIS — M79661 Pain in right lower leg: Secondary | ICD-10-CM | POA: Diagnosis not present

## 2020-02-18 DIAGNOSIS — G8929 Other chronic pain: Secondary | ICD-10-CM | POA: Diagnosis not present

## 2020-02-18 NOTE — Progress Notes (Signed)
[name] Phone: [phone] Subjective:   Fontaine No, am serving as a scribe for Dr. Hulan Saas. This visit occurred during the SARS-CoV-2 public health emergency.  Safety protocols were in place, including screening questions prior to the visit, additional usage of staff PPE, and extensive cleaning of exam room while observing appropriate contact time as indicated for disinfecting solutions.   CC: Right leg pain  [id number]  Peter Sanford is a 54 y.o. male coming in with complaint of right knee pain. Patient states that he felt pain during a run 6 weeks ago. Run was running uphill at time of injury. Pain is dull. Unable to run. Has been rolling out that area.  Patient would like to run more but is concerned if continuing to have pain.     Past Medical History:  Diagnosis Date  . GAD (generalized anxiety disorder)   . History of nephrolithiasis   . Pollen allergies   . Wears contact lenses    Past Surgical History:  Procedure Laterality Date  . COLONOSCOPY WITH PROPOFOL N/A 06/03/2017   Procedure: COLONOSCOPY WITH PROPOFOL;  Surgeon: Lucilla Lame, MD;  Location: [facility];  Service: Endoscopy;  Laterality: N/A;  . POLYPECTOMY  06/03/2017   Procedure: POLYPECTOMY;  Surgeon: Lucilla Lame, MD;  Location: [facility];  Service: Endoscopy;;  . WISDOM TOOTH EXTRACTION     Social History   Socioeconomic History  . Marital status: Married    Spouse name: Not on file  . Number of children: Not on file  . Years of education: Not on file  . Highest education level: Not on file  Occupational History  . Not on file  Tobacco Use  . Smoking status: Never Smoker  . Smokeless tobacco: Never Used  Vaping Use  . Vaping Use: Never used  Substance and Sexual Activity  . Alcohol use: Yes    Alcohol/week: 3.0 standard drinks    Types: 3 Cans of beer per week    Comment: occ  .  Drug use: No  . Sexual activity: Not on file  Other Topics Concern  . Not on file  Social History Narrative  . Not on file   Social Determinants of Health   Financial Resource Strain:   . Difficulty of Paying Living Expenses: Not on file  Food Insecurity:   . Worried About Charity fundraiser in the Last Year: Not on file  . Ran Out of Food in the Last Year: Not on file  Transportation Needs:   . Lack of Transportation (Medical): Not on file  . Lack of Transportation (Non-Medical): Not on file  Physical Activity:   . Days of Exercise per Week: Not on file  . Minutes of Exercise per Session: Not on file  Stress:   . Feeling of Stress : Not on file  Social Connections:   . Frequency of Communication with Friends and Family: Not on file  . Frequency of Social Gatherings with Friends and Family: Not on file  . Attends Religious Services: Not on file  . Active Member of Clubs or Organizations: Not on file  . Attends Archivist Meetings: Not on file  . Marital Status: Not on file   No Known Allergies No family history on file.    Current Outpatient Medications (Respiratory):  Marland Kitchen  Loratadine (CLARITIN) 10 MG CAPS, Take 1 capsule by mouth daily as needed.  Current Outpatient Medications (Other):  .  citalopram (CELEXA) 20 MG tablet, TAKE 1 TABLET BY MOUTH EVERY DAY .  Multiple Vitamin (MULTIVITAMIN) tablet, Take 1 tablet by mouth daily. .  Omega-3 Fatty Acids (OMEGA 3 PO), Take by mouth daily.   Reviewed prior external information including notes and imaging from  primary care provider As well as notes that were available from care everywhere and other healthcare systems.  Past medical history, social, surgical and family history all reviewed in electronic medical record.  No pertanent information unless stated regarding to the chief complaint.   Review of Systems:  No headache, visual changes, nausea, vomiting, diarrhea, constipation, dizziness, abdominal  pain, skin rash, fevers, chills, night sweats, weight loss, swollen lymph nodes, body aches, joint swelling, chest pain, shortness of breath, mood changes. POSITIVE muscle aches  Objective  Blood pressure 124/74, pulse 60, height 5' 7.5" (1.715 m), weight 176 lb (79.8 kg), SpO2 98 %.   General: No apparent distress alert and oriented x3 mood and affect normal, dressed appropriately.  HEENT: Pupils equal, extraocular movements intact  Respiratory: Patient's speak in full sentences and does not appear short of breath  Cardiovascular: No lower extremity edema, non tender, no erythema  Neuro: Cranial nerves II through XII are intact, neurovascularly intact in all extremities with 2+ DTRs and 2+ pulses.  Gait normal with good balance and coordination.  MSK: Right calf does seem to have some mild weakness compared to the contralateral side with dorsiflexion.  The patient does have good strength though of the gastroc head.  No defect noted.  No erythema noted.  Knee minimally tender over the pes anserine area but minimal over the joint space.  Negative McMurray's.  Limited musculoskeletal ultrasound was performed and interpreted by Lyndal Pulley  Limited ultrasound shows the patient does have a varicose vein going over the medial gastroc tendon going through the muscle belly itself.  Fully compressible.  Popliteal blood vessel fully compressible.  Meniscus appears to not have any true tear on the medial aspect.  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 Any transcriptional errors that result from this process are unintentional.

## 2020-02-18 NOTE — Assessment & Plan Note (Signed)
Right calf seems to have more of a varicose vein noted on ultrasound today.  No true muscle injury noted though.  Discussed with patient about the possible for compression, home exercise for what would be A induced exertional compartment syndrome.  Discussed icing regimen and home exercises, discussed which activities to do which wants to avoid.  Patient is to increase activity slowly over the course the next several days.  Follow-up again in 6 to 8 weeks to see how patient is responding.  Differential includes more of a knee pathology but I do not see anything significant on ultrasound as well as hamstring pathology.

## 2020-02-18 NOTE — Patient Instructions (Signed)
1/8th of an inch heel lift in shoes Compression socks or calf compression with running Exercise 3 times a week after running Ice 20 mins 2 times a day See me again in 5-6 weeks

## 2020-03-27 ENCOUNTER — Encounter: Payer: Self-pay | Admitting: Family Medicine

## 2020-03-31 ENCOUNTER — Ambulatory Visit: Payer: BC Managed Care – PPO | Admitting: Family Medicine

## 2020-07-23 ENCOUNTER — Other Ambulatory Visit: Payer: Self-pay | Admitting: Family Medicine

## 2020-07-23 NOTE — Telephone Encounter (Signed)
Please schedule CPE with fasting labs prior with Dr. Copland.  

## 2020-07-23 NOTE — Telephone Encounter (Signed)
Left voice message to call the office  

## 2020-07-29 NOTE — Telephone Encounter (Signed)
Left voice message to call the office  

## 2020-08-25 ENCOUNTER — Other Ambulatory Visit: Payer: Self-pay | Admitting: Family Medicine

## 2020-08-25 DIAGNOSIS — Z79899 Other long term (current) drug therapy: Secondary | ICD-10-CM

## 2020-08-25 DIAGNOSIS — Z13220 Encounter for screening for lipoid disorders: Secondary | ICD-10-CM

## 2020-08-25 DIAGNOSIS — Z131 Encounter for screening for diabetes mellitus: Secondary | ICD-10-CM

## 2020-08-25 DIAGNOSIS — Z125 Encounter for screening for malignant neoplasm of prostate: Secondary | ICD-10-CM

## 2020-08-26 ENCOUNTER — Other Ambulatory Visit: Payer: Self-pay

## 2020-08-26 ENCOUNTER — Other Ambulatory Visit (INDEPENDENT_AMBULATORY_CARE_PROVIDER_SITE_OTHER): Payer: BC Managed Care – PPO

## 2020-08-26 DIAGNOSIS — Z125 Encounter for screening for malignant neoplasm of prostate: Secondary | ICD-10-CM

## 2020-08-26 DIAGNOSIS — Z131 Encounter for screening for diabetes mellitus: Secondary | ICD-10-CM | POA: Diagnosis not present

## 2020-08-26 DIAGNOSIS — Z13220 Encounter for screening for lipoid disorders: Secondary | ICD-10-CM

## 2020-08-26 DIAGNOSIS — Z79899 Other long term (current) drug therapy: Secondary | ICD-10-CM | POA: Diagnosis not present

## 2020-08-26 LAB — HEPATIC FUNCTION PANEL
ALT: 19 U/L (ref 0–53)
AST: 19 U/L (ref 0–37)
Albumin: 4 g/dL (ref 3.5–5.2)
Alkaline Phosphatase: 43 U/L (ref 39–117)
Bilirubin, Direct: 0.1 mg/dL (ref 0.0–0.3)
Total Bilirubin: 0.7 mg/dL (ref 0.2–1.2)
Total Protein: 6.9 g/dL (ref 6.0–8.3)

## 2020-08-26 LAB — CBC WITH DIFFERENTIAL/PLATELET
Basophils Absolute: 0 10*3/uL (ref 0.0–0.1)
Basophils Relative: 1 % (ref 0.0–3.0)
Eosinophils Absolute: 0.2 10*3/uL (ref 0.0–0.7)
Eosinophils Relative: 5.6 % — ABNORMAL HIGH (ref 0.0–5.0)
HCT: 43.7 % (ref 39.0–52.0)
Hemoglobin: 14.8 g/dL (ref 13.0–17.0)
Lymphocytes Relative: 34.6 % (ref 12.0–46.0)
Lymphs Abs: 1.4 10*3/uL (ref 0.7–4.0)
MCHC: 33.7 g/dL (ref 30.0–36.0)
MCV: 93.3 fl (ref 78.0–100.0)
Monocytes Absolute: 0.4 10*3/uL (ref 0.1–1.0)
Monocytes Relative: 9.5 % (ref 3.0–12.0)
Neutro Abs: 2 10*3/uL (ref 1.4–7.7)
Neutrophils Relative %: 49.3 % (ref 43.0–77.0)
Platelets: 236 10*3/uL (ref 150.0–400.0)
RBC: 4.69 Mil/uL (ref 4.22–5.81)
RDW: 13.1 % (ref 11.5–15.5)
WBC: 4 10*3/uL (ref 4.0–10.5)

## 2020-08-26 LAB — BASIC METABOLIC PANEL
BUN: 17 mg/dL (ref 6–23)
CO2: 29 mEq/L (ref 19–32)
Calcium: 9 mg/dL (ref 8.4–10.5)
Chloride: 103 mEq/L (ref 96–112)
Creatinine, Ser: 1.17 mg/dL (ref 0.40–1.50)
GFR: 70.86 mL/min (ref >60.00)
Glucose, Bld: 126 mg/dL — ABNORMAL HIGH (ref 70–99)
Potassium: 4.2 mEq/L (ref 3.5–5.1)
Sodium: 136 mEq/L (ref 135–145)

## 2020-08-26 LAB — LIPID PANEL
Cholesterol: 215 mg/dL — ABNORMAL HIGH (ref 0–200)
HDL: 48.4 mg/dL (ref >39.00)
LDL Cholesterol: 138 mg/dL — ABNORMAL HIGH (ref 0–99)
NonHDL: 166.11
Total CHOL/HDL Ratio: 4
Triglycerides: 143 mg/dL (ref 0.0–149.0)
VLDL: 28.6 mg/dL (ref 0.0–40.0)

## 2020-08-26 LAB — HEMOGLOBIN A1C: Hgb A1c MFr Bld: 6.2 % (ref 4.6–6.5)

## 2020-08-27 ENCOUNTER — Other Ambulatory Visit: Payer: Self-pay

## 2020-08-27 LAB — PSA, TOTAL WITH REFLEX TO PSA, FREE: PSA, Total: 0.9 ng/mL (ref <=4.0)

## 2020-09-03 ENCOUNTER — Ambulatory Visit (INDEPENDENT_AMBULATORY_CARE_PROVIDER_SITE_OTHER): Payer: BC Managed Care – PPO | Admitting: Family Medicine

## 2020-09-03 ENCOUNTER — Other Ambulatory Visit: Payer: Self-pay

## 2020-09-03 ENCOUNTER — Encounter: Payer: Self-pay | Admitting: Family Medicine

## 2020-09-03 VITALS — BP 120/74 | HR 54 | Temp 98.3°F | Ht 67.5 in | Wt 178.5 lb

## 2020-09-03 DIAGNOSIS — Z0000 Encounter for general adult medical examination without abnormal findings: Secondary | ICD-10-CM

## 2020-09-03 NOTE — Progress Notes (Signed)
Chisa Kushner T. Dajon Rowe, MD, [place] at [facility] [place], [address]  Phone: [phone]  FAX: [place] - 55 y.o. male  MRN [id number]  Date of Birth: September 05, 1965  Date: 09/03/2020  PCP: Owens Loffler, MD  Referral: Owens Loffler, MD  Chief Complaint  Patient presents with  . Annual Exam    This visit occurred during the SARS-CoV-2 public health emergency.  Safety protocols were in place, including screening questions prior to the visit, additional usage of staff PPE, and extensive cleaning of exam room while observing appropriate contact time as indicated for disinfecting solutions.   Patient Care Team: Owens Loffler, MD as PCP - General (Family Medicine) Subjective:   Peter Sanford is a 55 y.o. pleasant patient who presents with the following:  Preventative Health Maintenance Visit:  Health Maintenance Summary Reviewed and updated, unless pt declines services.  Tobacco History Reviewed. Alcohol: No concerns, no excessive use Exercise Habits: Some activity, rec at least 30 mins 5 times a week STD concerns: no risk or activity to increase risk Drug Use: None  He has been having some knee pain, so he is now using his Peloton bike and has stopped running.  He does feel like he is getting a very high level of aerobic activity.  Health Maintenance  Topic Date Due  . Hepatitis C Screening  Never done  . HIV Screening  Never done  . COLONOSCOPY (Pts 45-28yrs Insurance coverage will need to be confirmed)  06/03/2022  . TETANUS/TDAP  06/12/2023  . INFLUENZA VACCINE  Completed  . COVID-19 Vaccine  Completed  . HPV VACCINES  Aged Out   Immunization History  Administered Date(s) Administered  . Influenza Nasal 05/14/2015  . Influenza Split 03/16/2012  . Influenza, Seasonal, Injecte, Preservative Fre 04/01/2019  . Influenza,inj,Quad  PF,6+ Mos 04/15/2017, 03/24/2018, 04/13/2020  . Moderna Sars-Covid-2 Vaccination 05/04/2020  . PFIZER(Purple Top)SARS-COV-2 Vaccination 08/31/2019, 09/21/2019  . Tdap 06/11/2013   Patient Active Problem List   Diagnosis Date Noted  . Benign neoplasm of rectosigmoid junction   . Prediabetes 02/11/2016  . Generalized anxiety disorder 02/02/2011  . 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  . ALLERGIC RHINITIS 11/22/2006  . NEPHROLITHIASIS, HX OF 11/22/2006    Past Medical History:  Diagnosis Date  . GAD (generalized anxiety disorder)   . History of nephrolithiasis   . Pollen allergies   . Wears contact lenses     Past Surgical History:  Procedure Laterality Date  . COLONOSCOPY WITH PROPOFOL N/A 06/03/2017   Procedure: COLONOSCOPY WITH PROPOFOL;  Surgeon: Lucilla Lame, MD;  Location: [place];  Service: Endoscopy;  Laterality: N/A;  . POLYPECTOMY  06/03/2017   Procedure: POLYPECTOMY;  Surgeon: Lucilla Lame, MD;  Location: [place];  Service: Endoscopy;;  . WISDOM TOOTH EXTRACTION      History reviewed. No pertinent family history.  Past Medical History, Surgical History, Social History, Family History, Problem List, Medications, and Allergies have been reviewed and updated if relevant.  Review of Systems: Pertinent positives are listed above.  Otherwise, a full 14 point review of systems has been done in full and it is negative except where it is noted positive.  Objective:   BP 120/74   Pulse (!) 54   Temp 98.3 F (36.8 C) (Temporal)   Ht 5' 7.5" (1.715 m)   Wt 178 lb 8 oz (81 kg)   SpO2 96%  BMI 27.54 kg/m  Ideal Body Weight: Weight in (lb) to have BMI = 25: 161.7  Ideal Body Weight: Weight in (lb) to have BMI = 25: 161.7 No exam data present Depression screen [facility] 2/9 09/03/2020 07/04/2019 06/26/2018  Decreased Interest 0 0 0  Down, Depressed, Hopeless 0 0 0  PHQ - 2 Score 0 0 0     GEN: well developed, well nourished, no acute distress Eyes: conjunctiva  and lids normal, PERRLA, EOMI ENT: TM clear, nares clear, oral exam WNL Neck: supple, no lymphadenopathy, no thyromegaly, no JVD Pulm: clear to auscultation and percussion, respiratory effort normal CV: regular rate and rhythm, S1-S2, no murmur, rub or gallop, no bruits, peripheral pulses normal and symmetric, no cyanosis, clubbing, edema or varicosities GI: soft, non-tender; no hepatosplenomegaly, masses; active bowel sounds all quadrants GU: deferred Lymph: no cervical, axillary or inguinal adenopathy MSK: gait normal, muscle tone and strength WNL, no joint swelling, effusions, discoloration, crepitus  SKIN: clear, good turgor, color WNL, no rashes, lesions, or ulcerations Neuro: normal mental status, normal strength, sensation, and motion Psych: alert; oriented to person, place and time, normally interactive and not anxious or depressed in appearance.  All labs reviewed with patient. Results for orders placed or performed in visit on 08/26/20  PSA, Total with Reflex to PSA, Free  Result Value Ref Range   PSA, Total 0.9 < OR = 4.0 ng/mL  Hemoglobin A1c  Result Value Ref Range   Hgb A1c MFr Bld 6.2 4.6 - 6.5 %  Basic metabolic panel  Result Value Ref Range   Sodium 136 135 - 145 mEq/L   Potassium 4.2 3.5 - 5.1 mEq/L   Chloride 103 96 - 112 mEq/L   CO2 29 19 - 32 mEq/L   Glucose, Bld 126 (H) 70 - 99 mg/dL   BUN 17 6 - 23 mg/dL   Creatinine, Ser 1.17 0.40 - 1.50 mg/dL   GFR 70.86 >60.00 mL/min   Calcium 9.0 8.4 - 10.5 mg/dL  Hepatic function panel  Result Value Ref Range   Total Bilirubin 0.7 0.2 - 1.2 mg/dL   Bilirubin, Direct 0.1 0.0 - 0.3 mg/dL   Alkaline Phosphatase 43 39 - 117 U/L   AST 19 0 - 37 U/L   ALT 19 0 - 53 U/L   Total Protein 6.9 6.0 - 8.3 g/dL   Albumin 4.0 3.5 - 5.2 g/dL  CBC with Differential/Platelet  Result Value Ref Range   WBC 4.0 4.0 - 10.5 K/uL   RBC 4.69 4.22 - 5.81 Mil/uL   Hemoglobin 14.8 13.0 - 17.0 g/dL   HCT 43.7 39.0 - 52.0 %   MCV 93.3  78.0 - 100.0 fl   MCHC 33.7 30.0 - 36.0 g/dL   RDW 13.1 11.5 - 15.5 %   Platelets 236.0 150.0 - 400.0 K/uL   Neutrophils Relative % 49.3 43.0 - 77.0 %   Lymphocytes Relative 34.6 12.0 - 46.0 %   Monocytes Relative 9.5 3.0 - 12.0 %   Eosinophils Relative 5.6 (H) 0.0 - 5.0 %   Basophils Relative 1.0 0.0 - 3.0 %   Neutro Abs 2.0 1.4 - 7.7 K/uL   Lymphs Abs 1.4 0.7 - 4.0 K/uL   Monocytes Absolute 0.4 0.1 - 1.0 K/uL   Eosinophils Absolute 0.2 0.0 - 0.7 K/uL   Basophils Absolute 0.0 0.0 - 0.1 K/uL  Lipid panel  Result Value Ref Range   Cholesterol 215 (H) 0 - 200 mg/dL   Triglycerides 143.0 0.0 -  149.0 mg/dL   HDL 48.40 >39.00 mg/dL   VLDL 28.6 0.0 - 40.0 mg/dL   LDL Cholesterol 138 (H) 0 - 99 mg/dL   Total CHOL/HDL Ratio 4    NonHDL 166.11     Assessment and Plan:     ICD-10-CM   1. Healthcare maintenance  Z00.00    Keep working on physical fitness.  He is prediabetic and he has some mildly elevated cholesterol.  Continue to work on [employer] as well.  Health Maintenance Exam: The patient's preventative maintenance and recommended screening tests for an annual wellness exam were reviewed in full today. Brought up to date unless services declined.  Counselled on the importance of diet, exercise, and its role in overall health and mortality. The patient's FH and SH was reviewed, including their home life, tobacco status, and drug and alcohol status.  Follow-up in 1 year for physical exam or additional follow-up below.  Follow-up: No follow-ups on file. Or follow-up in 1 year if not noted.  No orders of the defined types were placed in this encounter.  There are no discontinued medications. No orders of the defined types were placed in this encounter.   Signed,  Maud Deed. Myking Sar, MD   Allergies as of 09/03/2020   No Known Allergies     Medication List       Accurate as of September 03, 2020  2:26 PM. If you have any questions, ask your nurse or doctor.         citalopram 20 MG tablet Commonly known as: CELEXA TAKE 1 TABLET BY MOUTH EVERY DAY   Loratadine 10 MG Caps Take 1 capsule by mouth daily as needed.   multivitamin tablet Take 1 tablet by mouth daily.   OMEGA 3 PO Take by mouth daily.

## 2020-10-24 ENCOUNTER — Other Ambulatory Visit: Payer: Self-pay | Admitting: Family Medicine

## 2021-05-02 ENCOUNTER — Other Ambulatory Visit: Payer: Self-pay | Admitting: Family Medicine

## 2021-08-31 ENCOUNTER — Other Ambulatory Visit: Payer: Self-pay | Admitting: Family Medicine

## 2021-08-31 DIAGNOSIS — Z13220 Encounter for screening for lipoid disorders: Secondary | ICD-10-CM

## 2021-08-31 DIAGNOSIS — Z79899 Other long term (current) drug therapy: Secondary | ICD-10-CM

## 2021-08-31 DIAGNOSIS — Z1159 Encounter for screening for other viral diseases: Secondary | ICD-10-CM

## 2021-08-31 DIAGNOSIS — Z114 Encounter for screening for human immunodeficiency virus [HIV]: Secondary | ICD-10-CM

## 2021-08-31 DIAGNOSIS — Z125 Encounter for screening for malignant neoplasm of prostate: Secondary | ICD-10-CM

## 2021-08-31 DIAGNOSIS — Z131 Encounter for screening for diabetes mellitus: Secondary | ICD-10-CM

## 2021-09-02 ENCOUNTER — Other Ambulatory Visit (INDEPENDENT_AMBULATORY_CARE_PROVIDER_SITE_OTHER): Payer: BC Managed Care – PPO

## 2021-09-02 ENCOUNTER — Other Ambulatory Visit: Payer: Self-pay

## 2021-09-02 DIAGNOSIS — Z125 Encounter for screening for malignant neoplasm of prostate: Secondary | ICD-10-CM

## 2021-09-02 DIAGNOSIS — Z114 Encounter for screening for human immunodeficiency virus [HIV]: Secondary | ICD-10-CM

## 2021-09-02 DIAGNOSIS — Z79899 Other long term (current) drug therapy: Secondary | ICD-10-CM | POA: Diagnosis not present

## 2021-09-02 DIAGNOSIS — Z1159 Encounter for screening for other viral diseases: Secondary | ICD-10-CM

## 2021-09-02 DIAGNOSIS — Z13220 Encounter for screening for lipoid disorders: Secondary | ICD-10-CM | POA: Diagnosis not present

## 2021-09-02 DIAGNOSIS — Z131 Encounter for screening for diabetes mellitus: Secondary | ICD-10-CM

## 2021-09-02 LAB — BASIC METABOLIC PANEL
BUN: 24 mg/dL — ABNORMAL HIGH (ref 6–23)
CO2: 28 mEq/L (ref 19–32)
Calcium: 9 mg/dL (ref 8.4–10.5)
Chloride: 104 mEq/L (ref 96–112)
Creatinine, Ser: 1.07 mg/dL (ref 0.40–1.50)
GFR: 78.32 mL/min (ref >60.00)
Glucose, Bld: 138 mg/dL — ABNORMAL HIGH (ref 70–99)
Potassium: 4.6 mEq/L (ref 3.5–5.1)
Sodium: 138 mEq/L (ref 135–145)

## 2021-09-02 LAB — LIPID PANEL
Cholesterol: 204 mg/dL — ABNORMAL HIGH (ref 0–200)
HDL: 50.3 mg/dL (ref >39.00)
LDL Cholesterol: 135 mg/dL — ABNORMAL HIGH (ref 0–99)
NonHDL: 153.52
Total CHOL/HDL Ratio: 4
Triglycerides: 95 mg/dL (ref 0.0–149.0)
VLDL: 19 mg/dL (ref 0.0–40.0)

## 2021-09-02 LAB — CBC WITH DIFFERENTIAL/PLATELET
Basophils Absolute: 0 10*3/uL (ref 0.0–0.1)
Basophils Relative: 0.9 % (ref 0.0–3.0)
Eosinophils Absolute: 0.3 10*3/uL (ref 0.0–0.7)
Eosinophils Relative: 7.7 % — ABNORMAL HIGH (ref 0.0–5.0)
HCT: 45.8 % (ref 39.0–52.0)
Hemoglobin: 15.3 g/dL (ref 13.0–17.0)
Lymphocytes Relative: 29.9 % (ref 12.0–46.0)
Lymphs Abs: 1.1 10*3/uL (ref 0.7–4.0)
MCHC: 33.4 g/dL (ref 30.0–36.0)
MCV: 94.4 fl (ref 78.0–100.0)
Monocytes Absolute: 0.3 10*3/uL (ref 0.1–1.0)
Monocytes Relative: 7.7 % (ref 3.0–12.0)
Neutro Abs: 2.1 10*3/uL (ref 1.4–7.7)
Neutrophils Relative %: 53.8 % (ref 43.0–77.0)
Platelets: 273 10*3/uL (ref 150.0–400.0)
RBC: 4.86 Mil/uL (ref 4.22–5.81)
RDW: 12.9 % (ref 11.5–15.5)
WBC: 3.8 10*3/uL — ABNORMAL LOW (ref 4.0–10.5)

## 2021-09-02 LAB — HEPATIC FUNCTION PANEL
ALT: 18 U/L (ref 0–53)
AST: 17 U/L (ref 0–37)
Albumin: 4.3 g/dL (ref 3.5–5.2)
Alkaline Phosphatase: 50 U/L (ref 39–117)
Bilirubin, Direct: 0.2 mg/dL (ref 0.0–0.3)
Total Bilirubin: 1 mg/dL (ref 0.2–1.2)
Total Protein: 6.7 g/dL (ref 6.0–8.3)

## 2021-09-02 LAB — HEMOGLOBIN A1C: Hgb A1c MFr Bld: 6.2 % (ref 4.6–6.5)

## 2021-09-04 LAB — HEPATITIS C ANTIBODY
Hepatitis C Ab: NONREACTIVE
SIGNAL TO CUT-OFF: 0.02 (ref <1.00)

## 2021-09-04 LAB — HIV ANTIBODY (ROUTINE TESTING W REFLEX): HIV 1&2 Ab, 4th Generation: NONREACTIVE

## 2021-09-04 LAB — PSA, TOTAL WITH REFLEX TO PSA, FREE: PSA, Total: 0.6 ng/mL (ref <=4.0)

## 2021-09-09 ENCOUNTER — Ambulatory Visit (INDEPENDENT_AMBULATORY_CARE_PROVIDER_SITE_OTHER): Payer: BC Managed Care – PPO | Admitting: Family Medicine

## 2021-09-09 ENCOUNTER — Other Ambulatory Visit: Payer: Self-pay

## 2021-09-09 VITALS — BP 110/68 | HR 53 | Temp 98.3°F | Ht 67.5 in | Wt 172.4 lb

## 2021-09-09 DIAGNOSIS — Z0000 Encounter for general adult medical examination without abnormal findings: Secondary | ICD-10-CM

## 2021-09-09 NOTE — Progress Notes (Signed)
? ? ?Peter Yono T. Idrees Quam, MD, [facility] ?Therapist, music at [facility] ?[address] ?[place], [address] ? ?Phone: [phone]  FAX: [phone] ? ?Peter Sanford - 56 y.o. male  MRN [id number]  Date of Birth: October 17, 1965 ? ?Date: 09/09/2021  PCP: Owens Loffler, MD  Referral: Owens Loffler, MD ? ?Chief Complaint  ?Patient presents with  ? Annual Exam  ?  No concerns  ? ? ?This visit occurred during the SARS-CoV-2 public health emergency.  Safety protocols were in place, including screening questions prior to the visit, additional usage of staff PPE, and extensive cleaning of exam room while observing appropriate contact time as indicated for disinfecting solutions.  ? ?Patient Care Team: ?Owens Loffler, MD as PCP - General (Family Medicine) ?Subjective:  ? ?Peter Sanford is a 56 y.o. pleasant patient who presents with the following: ? ?Preventative Health Maintenance Visit: ? ?Health Maintenance Summary Reviewed and updated, unless pt declines services. ? ?Tobacco History Reviewed. ?Alcohol: No concerns, no excessive use - Friday and sat, a couple  ?Exercise Habits:  Peloton 30-45 mins 6x a week, rowing and running some.  ?STD concerns: no risk or activity to increase risk ?Drug Use: None ? ?Shingrix ?Colonoscopy due in 05/2022 ? ?Taking combo Tumeric, others, helping a lot with OA ? ?The 10-year ASCVD risk score (Arnett DK, et al., 2019) is: 4.4% ?  Values used to calculate the score: ?    Age: 56 years ?    Sex: Male ?    Is Non-Hispanic African American: No ?    Diabetic: No ?    Tobacco smoker: No ?    Systolic Blood Pressure: 419 mmHg ?    Is BP treated: No ?    HDL Cholesterol: 50.3 mg/dL ?    Total Cholesterol: 204 mg/dL  ? ?Health Maintenance  ?Topic Date Due  ? Zoster Vaccines- Shingrix (1 of 2) Never done  ? INFLUENZA VACCINE  01/26/2021  ? COLONOSCOPY (Pts 45-86yr Insurance coverage will need to be confirmed)  06/03/2022  ? TETANUS/TDAP   06/12/2023  ? COVID-19 Vaccine  Completed  ? Hepatitis C Screening  Completed  ? HIV Screening  Completed  ? HPV VACCINES  Aged Out  ? ?Immunization History  ?Administered Date(s) Administered  ? Influenza Nasal 05/14/2015  ? Influenza Split 03/16/2012  ? Influenza, Seasonal, Injecte, Preservative Fre 04/01/2019  ? Influenza,inj,Quad PF,6+ Mos 04/15/2017, 03/24/2018, 04/13/2020  ? Moderna Sars-Covid-2 Vaccination 05/04/2020  ? PFIZER(Purple Top)SARS-COV-2 Vaccination 08/31/2019, 09/21/2019  ? PPension scheme manager187yr& up 06/14/2021  ? Tdap 06/11/2013  ? ?Patient Active Problem List  ? Diagnosis Date Noted  ? Benign neoplasm of rectosigmoid junction   ? Prediabetes 02/11/2016  ? Generalized anxiety disorder 02/02/2011  ? 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  ? ALLERGIC RHINITIS 11/22/2006  ? NEPHROLITHIASIS, HX OF 11/22/2006  ? ? ?Past Medical History:  ?Diagnosis Date  ? GAD (generalized anxiety disorder)   ? History of nephrolithiasis   ? Pollen allergies   ? Wears contact lenses   ? ? ?Past Surgical History:  ?Procedure Laterality Date  ? COLONOSCOPY WITH PROPOFOL N/A 06/03/2017  ? Procedure: COLONOSCOPY WITH PROPOFOL;  Surgeon: WoLucilla LameMD;  [place] Service: [place];  Laterality: N/A;  ? POLYPECTOMY  06/03/2017  ? Procedure: POLYPECTOMY;  Surgeon: WoLucilla LameMD;  [place] Service: [place];;  ? WISDOM TOOTH EXTRACTION    ? ? ?No family history on file. ? ?  Past Medical History, Surgical History, Social History, Family History, Problem List, Medications, and Allergies have been reviewed and updated if relevant. ? ?Review of Systems: Pertinent positives are listed above.  Otherwise, a full 14 point review of systems has been done in full and it is negative except where it is noted positive. ? ?Objective:  ? ?BP 110/68   Pulse (!) 53   Temp 98.3 ?F (36.8 ?C) (Oral)   Ht 5' 7.5" (1.715 m)   Wt 172 lb 6 oz (78.2 kg)   SpO2 98%   BMI 26.60 kg/m?   ?Ideal Body Weight: Weight in (lb) to have BMI = 25: 161.7 ? ?Ideal Body Weight: Weight in (lb) to have BMI = 25: 161.7 ?No results found. ?Depression screen [facility] 2/9 09/09/2021 09/03/2020 07/04/2019 06/26/2018  ?Decreased Interest 0 0 0 0  ?Down, Depressed, Hopeless 0 0 0 0  ?PHQ - 2 Score 0 0 0 0  ? ? ? ?GEN: well developed, well nourished, no acute distress ?Eyes: conjunctiva and lids normal, PERRLA, EOMI ?ENT: TM clear, nares clear, oral exam WNL ?Neck: supple, no lymphadenopathy, no thyromegaly, no JVD ?Pulm: clear to auscultation and percussion, respiratory effort normal ?CV: regular rate and rhythm, S1-S2, no murmur, rub or gallop, no bruits, peripheral pulses normal and symmetric, no cyanosis, clubbing, edema or varicosities ?GI: soft, non-tender; no hepatosplenomegaly, masses; active bowel sounds all quadrants ?GU: deferred ?Lymph: no cervical, axillary or inguinal adenopathy ?MSK: gait normal, muscle tone and strength WNL, no joint swelling, effusions, discoloration, crepitus  ?SKIN: clear, good turgor, color WNL, no rashes, lesions, or ulcerations ?Neuro: normal mental status, normal strength, sensation, and motion ?Psych: alert; oriented to person, place and time, normally interactive and not anxious or depressed in appearance. ? ?All labs reviewed with patient. ?Results for orders placed or performed in visit on 09/02/21  ?HIV Antibody (routine testing w rflx)  ?Result Value Ref Range  ? HIV 1&2 Ab, 4th Generation NON-REACTIVE NON-REACTIVE  ?Hepatitis C antibody  ?Result Value Ref Range  ? Hepatitis C Ab NON-REACTIVE NON-REACTIVE  ? SIGNAL TO CUT-OFF 0.02 <1.00  ?PSA, Total with Reflex to PSA, Free  ?Result Value Ref Range  ? PSA, Total 0.6 < OR = 4.0 ng/mL  ?Hemoglobin A1c  ?Result Value Ref Range  ? Hgb A1c MFr Bld 6.2 4.6 - 6.5 %  ?CBC with Differential/Platelet  ?Result Value Ref Range  ? WBC 3.8 (L) 4.0 - 10.5 K/uL  ? RBC 4.86 4.22 - 5.81 Mil/uL  ? Hemoglobin 15.3 13.0 - 17.0 g/dL  ? HCT 45.8 39.0 -  52.0 %  ? MCV 94.4 78.0 - 100.0 fl  ? MCHC 33.4 30.0 - 36.0 g/dL  ? RDW 12.9 11.5 - 15.5 %  ? Platelets 273.0 150.0 - 400.0 K/uL  ? Neutrophils Relative % 53.8 43.0 - 77.0 %  ? Lymphocytes Relative 29.9 12.0 - 46.0 %  ? Monocytes Relative 7.7 3.0 - 12.0 %  ? Eosinophils Relative 7.7 (H) 0.0 - 5.0 %  ? Basophils Relative 0.9 0.0 - 3.0 %  ? Neutro Abs 2.1 1.4 - 7.7 K/uL  ? Lymphs Abs 1.1 0.7 - 4.0 K/uL  ? Monocytes Absolute 0.3 0.1 - 1.0 K/uL  ? Eosinophils Absolute 0.3 0.0 - 0.7 K/uL  ? Basophils Absolute 0.0 0.0 - 0.1 K/uL  ?Basic metabolic panel  ?Result Value Ref Range  ? Sodium 138 135 - 145 mEq/L  ? Potassium 4.6 3.5 - 5.1 mEq/L  ? Chloride 104 96 -  112 mEq/L  ? CO2 28 19 - 32 mEq/L  ? Glucose, Bld 138 (H) 70 - 99 mg/dL  ? BUN 24 (H) 6 - 23 mg/dL  ? Creatinine, Ser 1.07 0.40 - 1.50 mg/dL  ? GFR 78.32 >60.00 mL/min  ? Calcium 9.0 8.4 - 10.5 mg/dL  ?Hepatic function panel  ?Result Value Ref Range  ? Total Bilirubin 1.0 0.2 - 1.2 mg/dL  ? Bilirubin, Direct 0.2 0.0 - 0.3 mg/dL  ? Alkaline Phosphatase 50 39 - 117 U/L  ? AST 17 0 - 37 U/L  ? ALT 18 0 - 53 U/L  ? Total Protein 6.7 6.0 - 8.3 g/dL  ? Albumin 4.3 3.5 - 5.2 g/dL  ?Lipid panel  ?Result Value Ref Range  ? Cholesterol 204 (H) 0 - 200 mg/dL  ? Triglycerides 95.0 0.0 - 149.0 mg/dL  ? HDL 50.30 >39.00 mg/dL  ? VLDL 19.0 0.0 - 40.0 mg/dL  ? LDL Cholesterol 135 (H) 0 - 99 mg/dL  ? Total CHOL/HDL Ratio 4   ? NonHDL 153.52   ? ? ?Assessment and Plan:  ? ?  ICD-10-CM   ?1. Healthcare maintenance  Z00.00   ?  ? ?He is basically doing great.  Keep working out and eating healthy. ? ?Patient Instructions  ?Shingrix - shingles vaccine.  ? ?Health Maintenance Exam: The patient's preventative maintenance and recommended screening tests for an annual wellness exam were reviewed in full today. ?Brought up to date unless services declined. ? ?Counselled on the importance of diet, exercise, and its role in overall health and mortality. ?The patient's FH and SH was reviewed,  including their home life, tobacco status, and drug and alcohol status. ? ?Follow-up in 1 year for physical exam or additional follow-up below. ? ?Follow-up: No follow-ups on file. ?Or follow-up in 1 year if not noted. ? ?No

## 2021-09-09 NOTE — Patient Instructions (Signed)
Shingrix (shingles) vaccine   

## 2021-09-10 ENCOUNTER — Encounter: Payer: Self-pay | Admitting: Family Medicine

## 2021-11-09 ENCOUNTER — Other Ambulatory Visit: Payer: Self-pay | Admitting: Family Medicine

## 2022-02-17 ENCOUNTER — Ambulatory Visit
Admission: EM | Admit: 2022-02-17 | Discharge: 2022-02-17 | Disposition: A | Discharge status: Home / Self Care | Payer: BC Managed Care – PPO | Attending: Emergency Medicine | Admitting: Emergency Medicine

## 2022-02-17 DIAGNOSIS — H9312 Tinnitus, left ear: Secondary | ICD-10-CM | POA: Diagnosis not present

## 2022-02-17 DIAGNOSIS — H60392 Other infective otitis externa, left ear: Secondary | ICD-10-CM | POA: Diagnosis not present

## 2022-02-17 MED ORDER — OFLOXACIN 0.3 % OT SOLN
10.0000 [drp] | Freq: Every day | OTIC | 0 refills | Status: DC
Start: 2022-02-17 — End: 2022-09-13

## 2022-02-17 NOTE — ED Triage Notes (Signed)
Pt presents with c/o left ear fullness and dizziness that began yesterday

## 2022-02-17 NOTE — Discharge Instructions (Addendum)
On exam your ears do not clogged and on your left ear I was able to notate a yellow like fluid which may be the eardrops that you placed for drainage and therefore we will provide coverage for an infection of the ear canal  Place 10 drops of ofloxacin into the left ear every morning  You may use Tylenol or ibuprofen for management of discomfort  May hold warm compresses to the ear for additional comfort  Please not attempted any ear cleaning or object or fluid placement into the ear canal to prevent further irritation   Schedule an appointment with your ear nose and throat doctor for 7 days from now, if symptoms improve you may cancel the appointment with no further follow-up

## 2022-02-18 NOTE — ED Provider Notes (Signed)
Peter Sanford    CSN: [id number] Arrival date & time: 02/17/22  1854      History   Chief Complaint Chief Complaint  Patient presents with   Dizziness    Ringing in my ears. I have had a clogged ear. I think it may be vertigo and I need my ears cleaned. - Entered by patient    HPI Peter Sanford is a 56 y.o. male.   Patient presents with sided ear fullness and ringing that began today at approximately 10 to 11 AM.  Symptoms started abruptly without precipitating event, injury or trauma.  Feels as if his ears were clogged and therefore he attempted to clean with alcohol/peroxide but symptoms persisted.  Can be use of over-the-counter eardrops as well.  Denies ear itching, ear drainage, fever, chills or URI symptoms.  Past Medical History:  Diagnosis Date   GAD (generalized anxiety disorder)    History of nephrolithiasis    Pollen allergies    Wears contact lenses     Patient Active Problem List   Diagnosis Date Noted   Benign neoplasm of rectosigmoid junction    Prediabetes 02/11/2016   Generalized anxiety disorder 02/02/2011   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   ALLERGIC RHINITIS 11/22/2006   NEPHROLITHIASIS, HX OF 11/22/2006    Past Surgical History:  Procedure Laterality Date   COLONOSCOPY WITH PROPOFOL N/A 06/03/2017   Procedure: COLONOSCOPY WITH PROPOFOL;  Surgeon: Lucilla Lame, MD;  Location: [place];  Service: Endoscopy;  Laterality: N/A;   POLYPECTOMY  06/03/2017   Procedure: POLYPECTOMY;  Surgeon: Lucilla Lame, MD;  Location: [place];  Service: Endoscopy;;   WISDOM TOOTH EXTRACTION         Home Medications    Prior to Admission medications   Medication Sig Start Date End Date Taking? Authorizing Provider  ofloxacin (FLOXIN) 0.3 % OTIC solution Place 10 drops into the left ear daily. 02/17/22  Yes Shervon Kerwin R, NP  citalopram (CELEXA) 20 MG tablet TAKE 1 TABLET BY MOUTH EVERY DAY 11/09/21   Copland, Frederico Hamman, MD   Loratadine 10 MG CAPS Take 1 capsule by mouth daily as needed.    [provider]  Multiple Vitamin (MULTIVITAMIN) tablet Take 1 tablet by mouth daily.    [provider]  Omega-3 Fatty Acids (OMEGA 3 PO) Take by mouth daily.    [provider]    Family History No family history on file.  Social History Social History   Tobacco Use   Smoking status: Never   Smokeless tobacco: Never  Vaping Use   Vaping Use: Never used  Substance Use Topics   Alcohol use: Yes    Alcohol/week: 3.0 standard drinks of alcohol    Types: 3 Cans of beer per week    Comment: occ   Drug use: No     Allergies   Patient has no known allergies.   Review of Systems Review of Systems Defer to HPI   Physical Exam Triage Vital Signs ED Triage Vitals [02/17/22 1925]  Enc Vitals Group     BP 136/81     Pulse Rate (!) 47     Resp 18     Temp (!) 96.8 F (36 C)     Temp src      SpO2 98 %     Weight      Height      Head Circumference      Peak Flow      Pain Score  Pain Loc      Pain Edu?      Excl. in [place]?    No data found.  Updated Vital Signs BP 136/81   Pulse (!) 47   Temp (!) 96.8 F (36 C)   Resp 18   SpO2 98%   Visual Acuity Right Eye Distance:   Left Eye Distance:   Bilateral Distance:    Right Eye Near:   Left Eye Near:    Bilateral Near:     Physical Exam Constitutional:      Appearance: Normal appearance.  HENT:     Head: Normocephalic.     Right Ear: Tympanic membrane, ear canal and external ear normal.     Ears:     Comments: No abnormalities to the tympanic membrane, no obstruction by cerumen present, yellow thin discharge is noted in the ear canal without erythema or swelling, no abnormalities noted to the external ear Eyes:     Extraocular Movements: Extraocular movements intact.  Pulmonary:     Effort: Pulmonary effort is normal.  Neurological:     Mental Status: He is alert and oriented to person, place, and time.  Mental status is at baseline.  Psychiatric:        Mood and Affect: Mood normal.        Behavior: Behavior normal.      UC Treatments / Results  Labs (all labs ordered are listed, but only abnormal results are displayed) Labs Reviewed - No data to display  EKG   Radiology No results found.  Procedures Procedures (including critical care time)  Medications Ordered in UC Medications - No data to display  Initial Impression / Assessment and Plan / UC Course  I have reviewed the triage vital signs and the nursing notes.  Pertinent labs & imaging results that were available during my care of the patient were reviewed by me and considered in my medical decision making (see chart for details).  Effective otitis externa of left ear, tinnitus of left ear  There is no signs of infection on exam, discussed with patient, yellow drainage is noted, unsure if this is an infective discharge or product drops that have been used earlier today therefore we will provide coverage for an outer ear infection as patient is symptomatic, ofloxacin prescribed and discussed administration, recommended use of decongestants for additional coverage for the sinuses, advised against any ear cleaning and may use over-the-counter analgesics and warm compresses to the external ear for additional comfort, may follow-up with ear nose and throat if symptoms persist and worsen, already established with provider Final Clinical Impressions(s) / UC Diagnoses   Final diagnoses:  Tinnitus of left ear  Infective otitis externa of left ear     Discharge Instructions      On exam your ears do not clogged and on your left ear I was able to notate a yellow like fluid which may be the eardrops that you placed for drainage and therefore we will provide coverage for an infection of the ear canal  Place 10 drops of ofloxacin into the left ear every morning  You may use Tylenol or ibuprofen for management of  discomfort  May hold warm compresses to the ear for additional comfort  Please not attempted any ear cleaning or object or fluid placement into the ear canal to prevent further irritation   Schedule an appointment with your ear nose and throat doctor for 7 days from now, if symptoms improve you may cancel the  appointment with no further follow-up   ED Prescriptions     Medication Sig Dispense Auth. Provider   ofloxacin (FLOXIN) 0.3 % OTIC solution Place 10 drops into the left ear daily. 5 mL Hans Eden, NP      PDMP not reviewed this encounter.   Hans Eden, [place] 02/18/22 [phone]

## 2022-03-02 ENCOUNTER — Telehealth: Payer: Self-pay

## 2022-03-02 ENCOUNTER — Other Ambulatory Visit: Payer: Self-pay

## 2022-03-02 DIAGNOSIS — Z86010 Personal history of colonic polyps: Secondary | ICD-10-CM

## 2022-03-02 NOTE — Telephone Encounter (Signed)
Gastroenterology Pre-Procedure Review  Request Date: 06/03/22 Requesting Physician: Dr. Allen Norris  PATIENT REVIEW QUESTIONS: The patient responded to the following health history questions as indicated:    1. Are you having any GI issues? no 2. Do you have a personal history of Polyps? yes (last colonoscopy performed by Dr. Allen Norris 06/03/17 ) 3. Do you have a family history of Colon Cancer or Polyps? no 4. Diabetes Mellitus? no 5. Joint replacements in the past 12 months?no 6. Major health problems in the past 3 months?no 7. Any artificial heart valves, MVP, or defibrillator?no    MEDICATIONS & ALLERGIES:    Patient reports the following regarding taking any anticoagulation/antiplatelet therapy:   Plavix, Coumadin, Eliquis, Xarelto, Lovenox, Pradaxa, Brilinta, or Effient? no Aspirin? no  Patient confirms/reports the following medications:  Current Outpatient Medications  Medication Sig Dispense Refill   citalopram (CELEXA) 20 MG tablet TAKE 1 TABLET BY MOUTH EVERY DAY 90 tablet 1   Loratadine 10 MG CAPS Take 1 capsule by mouth daily as needed.     Multiple Vitamin (MULTIVITAMIN) tablet Take 1 tablet by mouth daily.     ofloxacin (FLOXIN) 0.3 % OTIC solution Place 10 drops into the left ear daily. 5 mL 0   Omega-3 Fatty Acids (OMEGA 3 PO) Take by mouth daily.     No current facility-administered medications for this visit.    Patient confirms/reports the following allergies:  No Known Allergies  No orders of the defined types were placed in this encounter.   AUTHORIZATION INFORMATION Primary Insurance: 1D#: Group #:  Secondary Insurance: 1D#: Group #:  SCHEDULE INFORMATION: Date: 06/03/22 Time: Location: [place]

## 2022-05-13 ENCOUNTER — Other Ambulatory Visit: Payer: Self-pay | Admitting: Family Medicine

## 2022-05-27 ENCOUNTER — Other Ambulatory Visit: Payer: Self-pay

## 2022-05-27 ENCOUNTER — Encounter: Payer: Self-pay | Admitting: Gastroenterology

## 2022-05-28 ENCOUNTER — Encounter: Payer: Self-pay | Admitting: Family Medicine

## 2022-05-31 ENCOUNTER — Telehealth: Payer: Self-pay | Admitting: *Deleted

## 2022-05-31 MED ORDER — NA SULFATE-K SULFATE-MG SULF 17.5-3.13-1.6 GM/177ML PO SOLN: 1.0000 | Freq: Once | ORAL | 0 refills | Status: AC

## 2022-05-31 NOTE — Telephone Encounter (Signed)
Patient called office requesting his prep solution to be sent to the pharmacy. Prescription for Suprep have been sent to [facility] as requested.

## 2022-06-03 ENCOUNTER — Encounter
Admission: RE | Disposition: A | Discharge status: Home / Self Care | Payer: Self-pay | Source: Ambulatory Visit | Attending: Gastroenterology

## 2022-06-03 ENCOUNTER — Encounter: Payer: Self-pay | Admitting: Gastroenterology

## 2022-06-03 ENCOUNTER — Ambulatory Visit
Admission: RE | Admit: 2022-06-03 | Discharge: 2022-06-03 | Disposition: A | Discharge status: Home / Self Care | Payer: BC Managed Care – PPO | Source: Ambulatory Visit | Attending: Gastroenterology | Admitting: Gastroenterology

## 2022-06-03 ENCOUNTER — Ambulatory Visit: Payer: BC Managed Care – PPO | Admitting: Anesthesiology

## 2022-06-03 ENCOUNTER — Other Ambulatory Visit: Payer: Self-pay

## 2022-06-03 DIAGNOSIS — K635 Polyp of colon: Secondary | ICD-10-CM | POA: Diagnosis not present

## 2022-06-03 DIAGNOSIS — F411 Generalized anxiety disorder: Secondary | ICD-10-CM | POA: Diagnosis not present

## 2022-06-03 DIAGNOSIS — Z1211 Encounter for screening for malignant neoplasm of colon: Secondary | ICD-10-CM | POA: Diagnosis present

## 2022-06-03 DIAGNOSIS — Z860100 Personal history of colon polyps, unspecified: Secondary | ICD-10-CM

## 2022-06-03 DIAGNOSIS — K640 First degree hemorrhoids: Secondary | ICD-10-CM | POA: Insufficient documentation

## 2022-06-03 DIAGNOSIS — D122 Benign neoplasm of ascending colon: Secondary | ICD-10-CM | POA: Insufficient documentation

## 2022-06-03 HISTORY — PX: POLYPECTOMY: SHX5525

## 2022-06-03 HISTORY — PX: COLONOSCOPY WITH PROPOFOL: SHX5780

## 2022-06-03 HISTORY — DX: Unspecified osteoarthritis, unspecified site: M19.90

## 2022-06-03 SURGERY — COLONOSCOPY WITH PROPOFOL
Anesthesia: General | Site: Rectum

## 2022-06-03 MED ORDER — LACTATED RINGERS IV SOLN: INTRAVENOUS | Status: DC

## 2022-06-03 MED ORDER — STERILE WATER FOR IRRIGATION IR SOLN
Status: DC | PRN
  Administered 2022-06-03: 150 mL

## 2022-06-03 MED ORDER — STERILE WATER FOR IRRIGATION IR SOLN
Status: DC | PRN
  Administered 2022-06-03: 60 mL

## 2022-06-03 MED ORDER — GLYCOPYRROLATE 0.2 MG/ML IJ SOLN
INTRAMUSCULAR | Status: DC | PRN
  Administered 2022-06-03: .15 mg via INTRAVENOUS

## 2022-06-03 MED ORDER — SODIUM CHLORIDE 0.9 % IV SOLN: INTRAVENOUS | Status: DC

## 2022-06-03 MED ORDER — LIDOCAINE HCL (CARDIAC) PF 100 MG/5ML IV SOSY
PREFILLED_SYRINGE | INTRAVENOUS | Status: DC | PRN
  Administered 2022-06-03: 80 mg via INTRAVENOUS

## 2022-06-03 MED ORDER — PROPOFOL 10 MG/ML IV BOLUS
INTRAVENOUS | Status: DC | PRN
  Administered 2022-06-03: 250 ug/kg/min via INTRAVENOUS

## 2022-06-03 SURGICAL SUPPLY — 21 items
CLIP HMST 235XBRD CATH ROT (MISCELLANEOUS) IMPLANT
CLIP RESOLUTION 360 11X235 (MISCELLANEOUS)
ELECT REM PT RETURN 9FT ADLT (ELECTROSURGICAL)
ELECTRODE REM PT RTRN 9FT ADLT (ELECTROSURGICAL) IMPLANT
FORCEPS BIOP RAD 4 LRG CAP 4 (CUTTING FORCEPS) IMPLANT
GOWN CVR UNV OPN BCK APRN NK (MISCELLANEOUS) ×6 IMPLANT
GOWN ISOL THUMB LOOP REG UNIV (MISCELLANEOUS) ×4
INJECTOR VARIJECT VIN23 (MISCELLANEOUS) IMPLANT
KIT DEFENDO VALVE AND CONN (KITS) IMPLANT
KIT PRC NS LF DISP ENDO (KITS) ×3 IMPLANT
KIT PROCEDURE OLYMPUS (KITS) ×2
MANIFOLD NEPTUNE II (INSTRUMENTS) ×3 IMPLANT
MARKER SPOT ENDO TATTOO 5ML (MISCELLANEOUS) IMPLANT
PROBE APC STR FIRE (PROBE) IMPLANT
RETRIEVER NET ROTH 2.5X230 LF (MISCELLANEOUS) IMPLANT
SNARE COLD EXACTO (MISCELLANEOUS) IMPLANT
SNARE SHORT THROW 13M SML OVAL (MISCELLANEOUS) IMPLANT
SNARE SNG USE RND 15MM (INSTRUMENTS) IMPLANT
TRAP ETRAP POLY (MISCELLANEOUS) IMPLANT
VARIJECT INJECTOR VIN23 (MISCELLANEOUS)
WATER STERILE IRR 250ML POUR (IV SOLUTION) ×3 IMPLANT

## 2022-06-03 NOTE — H&P (Signed)
Lucilla Lame, MD [address] [place], [place] [address] Phone:[phone] Fax : [phone]  Primary Care Physician:  Owens Loffler, MD Primary Gastroenterologist:  Dr. Allen Norris  Pre-Procedure History & Physical: HPI:  Peter Sanford is a 56 y.o. male is here for an colonoscopy.   Past Medical History:  Diagnosis Date   Arthritis    knees   GAD (generalized anxiety disorder)    History of nephrolithiasis    Pollen allergies    Wears contact lenses     Past Surgical History:  Procedure Laterality Date   COLONOSCOPY WITH PROPOFOL N/A 06/03/2017   Procedure: COLONOSCOPY WITH PROPOFOL;  Surgeon: Lucilla Lame, MD;  Location: [place];  Service: Endoscopy;  Laterality: N/A;   POLYPECTOMY  06/03/2017   Procedure: POLYPECTOMY;  Surgeon: Lucilla Lame, MD;  Location: [facility];  Service: Endoscopy;;   WISDOM TOOTH EXTRACTION     in 20's    Prior to Admission medications   Medication Sig Start Date End Date Taking? Authorizing Provider  citalopram (CELEXA) 20 MG tablet TAKE 1 TABLET BY MOUTH EVERY DAY 05/13/22  Yes Copland, Frederico Hamman, MD  ELDERBERRY PO Take by mouth.   Yes [provider]  Loratadine 10 MG CAPS Take 1 capsule by mouth daily as needed.  seasonal   Yes [provider]  Multiple Vitamin (MULTIVITAMIN) tablet Take 1 tablet by mouth daily.   Yes [provider]  Omega-3 Fatty Acids (OMEGA 3 PO) Take by mouth daily.   Yes [provider]  ofloxacin (FLOXIN) 0.3 % OTIC solution Place 10 drops into the left ear daily. Patient not taking: Reported on 05/27/2022 02/17/22   Hans Eden, NP    Allergies as of 03/02/2022   (No Known Allergies)    History reviewed. No pertinent family history.  Social History   Socioeconomic History   Marital status: Married    Spouse name: Not on file   Number of children: Not on file   Years of education: Not on file   Highest education level: Not  on file  Occupational History   Not on file  Tobacco Use   Smoking status: Never   Smokeless tobacco: Never  Vaping Use   Vaping Use: Never used  Substance and Sexual Activity   Alcohol use: Yes    Alcohol/week: 3.0 standard drinks of alcohol    Types: 3 Cans of beer per week    Comment: weekends   Drug use: No   Sexual activity: Not on file  Other Topics Concern   Not on file  Social History Narrative   Not on file   Social Determinants of Health   Financial Resource Strain: Not on file  Food Insecurity: Not on file  Transportation Needs: Not on file  Physical Activity: Not on file  Stress: Not on file  Social Connections: Not on file  Intimate Partner Violence: Not on file    Review of Systems: See HPI, otherwise negative ROS  Physical Exam: BP 138/74   Pulse (!) 46   Temp 97.6 F (36.4 C) (Temporal)   Ht '5\' 8"'$  (1.727 m)   Wt 79.8 kg   SpO2 100%   BMI 26.76 kg/m  General:   Alert,  pleasant and cooperative in NAD Head:  Normocephalic and atraumatic. Neck:  Supple; no masses or thyromegaly. Lungs:  Clear throughout to auscultation.    Heart:  Regular rate and rhythm. Abdomen:  Soft, nontender and nondistended. Normal bowel sounds,  without guarding, and without rebound.   Neurologic:  Alert and  oriented x4;  grossly normal neurologically.  Impression/Plan: Peter Sanford is here for an colonoscopy to be performed for a history of adenomatous polyps on 2018   Risks, benefits, limitations, and alternatives regarding  colonoscopy have been reviewed with the patient.  Questions have been answered.  All parties agreeable.   Lucilla Lame, MD  06/03/2022, 8:18 AM

## 2022-06-03 NOTE — Anesthesia Preprocedure Evaluation (Signed)
Anesthesia Evaluation  Patient identified by MRN, date of birth, ID band Patient awake    Reviewed: Allergy & Precautions, NPO status , Patient's Chart, lab work & pertinent test results  History of Anesthesia Complications Negative for: history of anesthetic complications  Airway Mallampati: II  TM Distance: >3 FB Neck ROM: Full    Dental no notable dental hx. (+) Teeth Intact   Pulmonary neg pulmonary ROS, neg sleep apnea, neg COPD, Patient abstained from smoking.Not current smoker   Pulmonary exam normal breath sounds clear to auscultation       Cardiovascular Exercise Tolerance: Good METS(-) hypertension(-) CAD and (-) Past MI negative cardio ROS (-) dysrhythmias  Rhythm:Regular Rate:Normal - Systolic murmurs    Neuro/Psych  PSYCHIATRIC DISORDERS Anxiety     negative neurological ROS     GI/Hepatic ,neg GERD  ,,(+)     (-) substance abuse    Endo/Other  neg diabetes    Renal/GU negative Renal ROS     Musculoskeletal   Abdominal   Peds  Hematology   Anesthesia Other Findings Past Medical History: No date: Arthritis     Comment:  knees No date: GAD (generalized anxiety disorder) No date: History of nephrolithiasis No date: Pollen allergies No date: Wears contact lenses  Reproductive/Obstetrics                             Anesthesia Physical Anesthesia Plan  ASA: 2  Anesthesia Plan: General   Post-op Pain Management: Minimal or no pain anticipated   Induction: Intravenous  PONV Risk Score and Plan: 2 and Propofol infusion, TIVA and Ondansetron  Airway Management Planned: Nasal Cannula  Additional Equipment: None  Intra-op Plan:   Post-operative Plan:   Informed Consent: I have reviewed the patients History and Physical, chart, labs and discussed the procedure including the risks, benefits and alternatives for the proposed anesthesia with the patient or authorized  representative who has indicated his/her understanding and acceptance.     Dental advisory given  Plan Discussed with: CRNA and Surgeon  Anesthesia Plan Comments: (Discussed risks of anesthesia with patient, including possibility of difficulty with spontaneous ventilation under anesthesia necessitating airway intervention, PONV, and rare risks such as cardiac or respiratory or neurological events, and allergic reactions. Discussed the role of CRNA in patient's perioperative care. Patient understands.)       Anesthesia Quick Evaluation

## 2022-06-03 NOTE — Op Note (Signed)
[facility] Gastroenterology Patient Name: Peter Sanford Procedure Date: 06/03/2022 8:23 AM MRN: [id number] Account #: [id number] Date of Birth: 1965-07-13 Admit Type: Outpatient Age: 56 Room: [facility] OR ROOM 01 Gender: Male Note Status: Finalized Instrument Name: [id number] Procedure:             Colonoscopy Indications:           High risk colon cancer surveillance: Personal history                         of colonic polyps Providers:             Lucilla Lame MD, MD Referring MD:          Beryle Flock MD, MD (Referring MD) Medicines:             Propofol per Anesthesia Complications:         No immediate complications. Procedure:             Pre-Anesthesia Assessment:                        - Prior to the procedure, a History and Physical was                         performed, and patient medications and allergies were                         reviewed. The patient's tolerance of previous                         anesthesia was also reviewed. The risks and benefits                         of the procedure and the sedation options and risks                         were discussed with the patient. All questions were                         answered, and informed consent was obtained. Prior                         Anticoagulants: The patient has taken no anticoagulant                         or antiplatelet agents. ASA Grade Assessment: II - A                         patient with mild systemic disease. After reviewing                         the risks and benefits, the patient was deemed in                         satisfactory condition to undergo the procedure.                        After obtaining informed consent, the colonoscope was  passed under direct vision. Throughout the procedure,                         the patient's blood pressure, pulse, and oxygen                         saturations were monitored continuously. The                          Colonoscope was introduced through the anus and                         advanced to the the cecum, identified by appendiceal                         orifice and ileocecal valve. The colonoscopy was                         performed without difficulty. The patient tolerated                         the procedure well. The quality of the bowel                         preparation was excellent. Findings:      The perianal and digital rectal examinations were normal.      A 4 mm polyp was found in the ascending colon. The polyp was sessile.       The polyp was removed with a cold snare. Resection and retrieval were       complete.      Non-bleeding internal hemorrhoids were found during retroflexion. The       hemorrhoids were Grade I (internal hemorrhoids that do not prolapse). Impression:            - One 4 mm polyp in the ascending colon, removed with                         a cold snare. Resected and retrieved.                        - Non-bleeding internal hemorrhoids. Recommendation:        - Discharge patient to home.                        - Resume previous diet.                        - Continue present medications.                        - Await pathology results.                        - Repeat colonoscopy in 7 years for surveillance. Procedure Code(s):     --- Professional ---                        [phone], Colonoscopy, flexible; with removal of  tumor(s), polyp(s), or other lesion(s) by snare                         technique Diagnosis Code(s):     --- Professional ---                        Z86.010, Personal history of colonic polyps                        D12.2, Benign neoplasm of ascending colon CPT copyright 2022 American Medical Association. All rights reserved. The codes documented in this report are preliminary and upon coder review may  be revised to meet current compliance requirements. Lucilla Lame MD, MD 06/03/2022 8:54:24 AM This  report has been signed electronically. Number of Addenda: 0 Note Initiated On: 06/03/2022 8:23 AM Scope Withdrawal Time: 0 hours 7 minutes 50 seconds  Total Procedure Duration: 0 hours 13 minutes 26 seconds  Estimated Blood Loss:  Estimated blood loss: none.      [facility]

## 2022-06-03 NOTE — Anesthesia Postprocedure Evaluation (Signed)
Anesthesia Post Note  Patient: Peter Sanford  Procedure(s) Performed: COLONOSCOPY WITH PROPOFOL (Rectum) POLYPECTOMY (Rectum)  Patient location during evaluation: PACU Anesthesia Type: General Level of consciousness: awake and alert Pain management: pain level controlled Vital Signs Assessment: post-procedure vital signs reviewed and stable Respiratory status: spontaneous breathing, nonlabored ventilation, respiratory function stable and patient connected to nasal cannula oxygen Cardiovascular status: blood pressure returned to baseline and stable Postop Assessment: no apparent nausea or vomiting Anesthetic complications: no   No notable events documented.   Last Vitals:  Vitals:   06/03/22 0900 06/03/22 0912  BP: 112/64 113/63  Pulse: (!) 52 (!) 54  Resp: 18 17  Temp:    SpO2: 99% 98%    Last Pain:  Vitals:   06/03/22 0912  TempSrc:   PainSc: 0-No pain                 Arita Miss

## 2022-06-03 NOTE — Transfer of Care (Signed)
Immediate Anesthesia Transfer of Care Note  Patient: Peter Sanford  Procedure(s) Performed: COLONOSCOPY WITH PROPOFOL (Rectum) POLYPECTOMY (Rectum)  Patient Location: PACU  Anesthesia Type: General  Level of Consciousness: awake, alert  and patient cooperative  Airway and Oxygen Therapy: Patient Spontanous Breathing and Patient connected to supplemental oxygen  Post-op Assessment: Post-op Vital signs reviewed, Patient's Cardiovascular Status Stable, Respiratory Function Stable, Patent Airway and No signs of Nausea or vomiting  Post-op Vital Signs: Reviewed and stable  Complications: No notable events documented.

## 2022-06-04 ENCOUNTER — Encounter: Payer: Self-pay | Admitting: Gastroenterology

## 2022-06-07 LAB — SURGICAL PATHOLOGY

## 2022-06-08 ENCOUNTER — Encounter: Payer: Self-pay | Admitting: Gastroenterology

## 2022-06-11 ENCOUNTER — Encounter: Payer: Self-pay | Admitting: Gastroenterology

## 2022-08-28 ENCOUNTER — Other Ambulatory Visit: Payer: Self-pay | Admitting: Family Medicine

## 2022-08-28 DIAGNOSIS — Z13220 Encounter for screening for lipoid disorders: Secondary | ICD-10-CM

## 2022-08-28 DIAGNOSIS — R7303 Prediabetes: Secondary | ICD-10-CM

## 2022-08-28 DIAGNOSIS — Z79899 Other long term (current) drug therapy: Secondary | ICD-10-CM

## 2022-08-28 DIAGNOSIS — Z125 Encounter for screening for malignant neoplasm of prostate: Secondary | ICD-10-CM

## 2022-09-08 ENCOUNTER — Other Ambulatory Visit (INDEPENDENT_AMBULATORY_CARE_PROVIDER_SITE_OTHER): Payer: BC Managed Care – PPO

## 2022-09-08 DIAGNOSIS — R7303 Prediabetes: Secondary | ICD-10-CM | POA: Diagnosis not present

## 2022-09-08 DIAGNOSIS — Z13220 Encounter for screening for lipoid disorders: Secondary | ICD-10-CM

## 2022-09-08 DIAGNOSIS — Z79899 Other long term (current) drug therapy: Secondary | ICD-10-CM | POA: Diagnosis not present

## 2022-09-08 DIAGNOSIS — Z125 Encounter for screening for malignant neoplasm of prostate: Secondary | ICD-10-CM

## 2022-09-08 LAB — CBC WITH DIFFERENTIAL/PLATELET
Basophils Absolute: 0 10*3/uL (ref 0.0–0.1)
Basophils Relative: 1 % (ref 0.0–3.0)
Eosinophils Absolute: 0.4 10*3/uL (ref 0.0–0.7)
Eosinophils Relative: 9.8 % — ABNORMAL HIGH (ref 0.0–5.0)
HCT: 46.9 % (ref 39.0–52.0)
Hemoglobin: 15.7 g/dL (ref 13.0–17.0)
Lymphocytes Relative: 37.7 % (ref 12.0–46.0)
Lymphs Abs: 1.7 10*3/uL (ref 0.7–4.0)
MCHC: 33.5 g/dL (ref 30.0–36.0)
MCV: 94.6 fl (ref 78.0–100.0)
Monocytes Absolute: 0.3 10*3/uL (ref 0.1–1.0)
Monocytes Relative: 7.4 % (ref 3.0–12.0)
Neutro Abs: 2 10*3/uL (ref 1.4–7.7)
Neutrophils Relative %: 44.1 % (ref 43.0–77.0)
Platelets: 253 10*3/uL (ref 150.0–400.0)
RBC: 4.95 Mil/uL (ref 4.22–5.81)
RDW: 13.1 % (ref 11.5–15.5)
WBC: 4.5 10*3/uL (ref 4.0–10.5)

## 2022-09-08 LAB — HEPATIC FUNCTION PANEL
ALT: 15 U/L (ref 0–53)
AST: 16 U/L (ref 0–37)
Albumin: 4 g/dL (ref 3.5–5.2)
Alkaline Phosphatase: 47 U/L (ref 39–117)
Bilirubin, Direct: 0.1 mg/dL (ref 0.0–0.3)
Total Bilirubin: 0.4 mg/dL (ref 0.2–1.2)
Total Protein: 6.9 g/dL (ref 6.0–8.3)

## 2022-09-08 LAB — LIPID PANEL
Cholesterol: 227 mg/dL — ABNORMAL HIGH (ref 0–200)
HDL: 51.4 mg/dL (ref >39.00)
LDL Cholesterol: 154 mg/dL — ABNORMAL HIGH (ref 0–99)
NonHDL: 175.34
Total CHOL/HDL Ratio: 4
Triglycerides: 109 mg/dL (ref 0.0–149.0)
VLDL: 21.8 mg/dL (ref 0.0–40.0)

## 2022-09-08 LAB — BASIC METABOLIC PANEL
BUN: 31 mg/dL — ABNORMAL HIGH (ref 6–23)
CO2: 27 mEq/L (ref 19–32)
Calcium: 9.2 mg/dL (ref 8.4–10.5)
Chloride: 103 mEq/L (ref 96–112)
Creatinine, Ser: 1.19 mg/dL (ref 0.40–1.50)
GFR: 68.45 mL/min (ref >60.00)
Glucose, Bld: 126 mg/dL — ABNORMAL HIGH (ref 70–99)
Potassium: 4.6 mEq/L (ref 3.5–5.1)
Sodium: 138 mEq/L (ref 135–145)

## 2022-09-08 LAB — HEMOGLOBIN A1C: Hgb A1c MFr Bld: 6.3 % (ref 4.6–6.5)

## 2022-09-09 LAB — PSA, TOTAL WITH REFLEX TO PSA, FREE: PSA, Total: 0.7 ng/mL (ref <=4.0)

## 2022-09-11 NOTE — Progress Notes (Unsigned)
Peter Gerrard T. Maleki Hippe, MD, [place] at [facility] [place], [address]  Phone: [phone]  FAX: [place] - 57 y.o. male  MRN [id number]  Date of Birth: April 15, 1966  Date: 09/13/2022  PCP: Owens Loffler, MD  Referral: Owens Loffler, MD  No chief complaint on file.  Patient Care Team: Owens Loffler, MD as PCP - General (Family Medicine) Subjective:   Peter Sanford is a 57 y.o. pleasant patient who presents with the following:  Preventative Health Maintenance Visit:  Health Maintenance Summary Reviewed and updated, unless pt declines services.  Tobacco History Reviewed. Alcohol: No concerns, no excessive use Exercise Habits: Some activity, rec at least 30 mins 5 times a week STD concerns: no risk or activity to increase risk Drug Use: None  Shingrix Covid booster  Health Maintenance  Topic Date Due   Zoster Vaccines- Shingrix (1 of 2) Never done   INFLUENZA VACCINE  01/26/2022   COVID-19 Vaccine (5 - 2023-24 season) 02/26/2022   DTaP/Tdap/Td (2 - Td or Tdap) 06/12/2023   COLONOSCOPY (Pts 45-4yrs Insurance coverage will need to be confirmed)  06/04/2027   Hepatitis C Screening  Completed   HIV Screening  Completed   HPV VACCINES  Aged Out   Immunization History  Administered Date(s) Administered   Influenza Nasal 05/14/2015   Influenza Split 03/16/2012   Influenza, Seasonal, Injecte, Preservative Fre 04/01/2019   Influenza,inj,Quad PF,6+ Mos 04/15/2017, 03/24/2018, 04/13/2020   Moderna Sars-Covid-2 Vaccination 05/04/2020   PFIZER(Purple Top)SARS-COV-2 Vaccination 08/31/2019, 09/21/2019   Pfizer Covid-19 Vaccine Bivalent Booster 60yrs & up 06/14/2021   Tdap 06/11/2013   Patient Active Problem List   Diagnosis Date Noted   History of colonic polyps 06/03/2022   Polyp of ascending colon 06/03/2022   Benign neoplasm of rectosigmoid junction    Prediabetes  02/11/2016   Generalized anxiety disorder 02/02/2011   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   ALLERGIC RHINITIS 11/22/2006   NEPHROLITHIASIS, HX OF 11/22/2006    Past Medical History:  Diagnosis Date   Arthritis    knees   GAD (generalized anxiety disorder)    History of nephrolithiasis    Pollen allergies    Wears contact lenses     Past Surgical History:  Procedure Laterality Date   COLONOSCOPY WITH PROPOFOL N/A 06/03/2017   Procedure: COLONOSCOPY WITH PROPOFOL;  Surgeon: Lucilla Lame, MD;  Location: [place];  Service: Endoscopy;  Laterality: N/A;   COLONOSCOPY WITH PROPOFOL N/A 06/03/2022   Procedure: COLONOSCOPY WITH PROPOFOL;  Surgeon: Lucilla Lame, MD;  Location: [place];  Service: Endoscopy;  Laterality: N/A;   POLYPECTOMY  06/03/2017   Procedure: POLYPECTOMY;  Surgeon: Lucilla Lame, MD;  Location: [place];  Service: Endoscopy;;   POLYPECTOMY  06/03/2022   Procedure: POLYPECTOMY;  Surgeon: Lucilla Lame, MD;  Location: [place];  Service: Endoscopy;;   WISDOM TOOTH EXTRACTION     in 20's    No family history on file.  Social History   Social History Narrative   Not on file    Past Medical History, Surgical History, Social History, Family History, Problem List, Medications, and Allergies have been reviewed and updated if relevant.  Review of Systems: Pertinent positives are listed above.  Otherwise, a full 14 point review of systems has been done in full and it is negative except where it is noted positive.  Objective:   There were no vitals taken for this visit. Ideal Body  Weight:    Ideal Body Weight:   No results found.    09/09/2021    2:38 PM 09/03/2020    2:06 PM 07/04/2019    2:04 PM 06/26/2018    3:04 PM  Depression screen PHQ 2/9  Decreased Interest 0 0 0 0  Down, Depressed, Hopeless 0 0 0 0  PHQ - 2 Score 0 0 0 0     GEN: well developed, well nourished, no acute distress Eyes: conjunctiva and lids normal,  PERRLA, EOMI ENT: TM clear, nares clear, oral exam WNL Neck: supple, no lymphadenopathy, no thyromegaly, no JVD Pulm: clear to auscultation and percussion, respiratory effort normal CV: regular rate and rhythm, S1-S2, no murmur, rub or gallop, no bruits, peripheral pulses normal and symmetric, no cyanosis, clubbing, edema or varicosities GI: soft, non-tender; no hepatosplenomegaly, masses; active bowel sounds all quadrants GU: deferred Lymph: no cervical, axillary or inguinal adenopathy MSK: gait normal, muscle tone and strength WNL, no joint swelling, effusions, discoloration, crepitus  SKIN: clear, good turgor, color WNL, no rashes, lesions, or ulcerations Neuro: normal mental status, normal strength, sensation, and motion Psych: alert; oriented to person, place and time, normally interactive and not anxious or depressed in appearance.  All labs reviewed with patient. Results for orders placed or performed in visit on 09/08/22  PSA, Total with Reflex to PSA, Free  Result Value Ref Range   PSA, Total 0.7 < OR = 4.0 ng/mL  Lipid panel  Result Value Ref Range   Cholesterol 227 (H) 0 - 200 mg/dL   Triglycerides 109.0 0.0 - 149.0 mg/dL   HDL 51.40 >39.00 mg/dL   VLDL 21.8 0.0 - 40.0 mg/dL   LDL Cholesterol 154 (H) 0 - 99 mg/dL   Total CHOL/HDL Ratio 4    NonHDL 175.34   Hemoglobin A1c  Result Value Ref Range   Hgb A1c MFr Bld 6.3 4.6 - 6.5 %  Hepatic function panel  Result Value Ref Range   Total Bilirubin 0.4 0.2 - 1.2 mg/dL   Bilirubin, Direct 0.1 0.0 - 0.3 mg/dL   Alkaline Phosphatase 47 39 - 117 U/L   AST 16 0 - 37 U/L   ALT 15 0 - 53 U/L   Total Protein 6.9 6.0 - 8.3 g/dL   Albumin 4.0 3.5 - 5.2 g/dL  CBC with Differential/Platelet  Result Value Ref Range   WBC 4.5 4.0 - 10.5 K/uL   RBC 4.95 4.22 - 5.81 Mil/uL   Hemoglobin 15.7 13.0 - 17.0 g/dL   HCT 46.9 39.0 - 52.0 %   MCV 94.6 78.0 - 100.0 fl   MCHC 33.5 30.0 - 36.0 g/dL   RDW 13.1 11.5 - 15.5 %   Platelets 253.0  150.0 - 400.0 K/uL   Neutrophils Relative % 44.1 43.0 - 77.0 %   Lymphocytes Relative 37.7 12.0 - 46.0 %   Monocytes Relative 7.4 3.0 - 12.0 %   Eosinophils Relative 9.8 (H) 0.0 - 5.0 %   Basophils Relative 1.0 0.0 - 3.0 %   Neutro Abs 2.0 1.4 - 7.7 K/uL   Lymphs Abs 1.7 0.7 - 4.0 K/uL   Monocytes Absolute 0.3 0.1 - 1.0 K/uL   Eosinophils Absolute 0.4 0.0 - 0.7 K/uL   Basophils Absolute 0.0 0.0 - 0.1 K/uL  Basic metabolic panel  Result Value Ref Range   Sodium 138 135 - 145 mEq/L   Potassium 4.6 3.5 - 5.1 mEq/L   Chloride 103 96 - 112 mEq/L   CO2 27  19 - 32 mEq/L   Glucose, Bld 126 (H) 70 - 99 mg/dL   BUN 31 (H) 6 - 23 mg/dL   Creatinine, Ser 1.19 0.40 - 1.50 mg/dL   GFR 68.45 >60.00 mL/min   Calcium 9.2 8.4 - 10.5 mg/dL    Assessment and Plan:     ICD-10-CM   1. Healthcare maintenance  Z00.00       Health Maintenance Exam: The patient's preventative maintenance and recommended screening tests for an annual wellness exam were reviewed in full today. Brought up to date unless services declined.  Counselled on the importance of diet, exercise, and its role in overall health and mortality. The patient's FH and SH was reviewed, including their home life, tobacco status, and drug and alcohol status.  Follow-up in 1 year for physical exam or additional follow-up below.  Disposition: No follow-ups on file.  No orders of the defined types were placed in this encounter.  There are no discontinued medications. No orders of the defined types were placed in this encounter.   Signed,  Maud Deed. Tiffanyann Deroo, MD   Allergies as of 09/13/2022   No Known Allergies      Medication List        Accurate as of September 11, 2022 10:10 AM. If you have any questions, ask your nurse or doctor.          citalopram 20 MG tablet Commonly known as: CELEXA TAKE 1 TABLET BY MOUTH EVERY DAY   ELDERBERRY PO Take by mouth.   Loratadine 10 MG Caps Take 1 capsule by mouth daily as  needed.  seasonal   multivitamin tablet Take 1 tablet by mouth daily.   ofloxacin 0.3 % OTIC solution Commonly known as: FLOXIN Place 10 drops into the left ear daily.   OMEGA 3 PO Take by mouth daily.

## 2022-09-13 ENCOUNTER — Encounter: Payer: Self-pay | Admitting: Family Medicine

## 2022-09-13 ENCOUNTER — Ambulatory Visit (INDEPENDENT_AMBULATORY_CARE_PROVIDER_SITE_OTHER): Payer: BC Managed Care – PPO | Admitting: Family Medicine

## 2022-09-13 VITALS — BP 120/80 | HR 57 | Temp 98.0°F | Ht 67.25 in | Wt 179.5 lb

## 2022-09-13 DIAGNOSIS — Z0000 Encounter for general adult medical examination without abnormal findings: Secondary | ICD-10-CM | POA: Diagnosis not present

## 2022-11-18 ENCOUNTER — Other Ambulatory Visit: Payer: Self-pay | Admitting: Family Medicine

## 2023-04-06 ENCOUNTER — Ambulatory Visit: Payer: BC Managed Care – PPO | Admitting: Family Medicine

## 2023-04-06 ENCOUNTER — Encounter: Payer: Self-pay | Admitting: Family Medicine

## 2023-04-06 VITALS — BP 127/68 | HR 50 | Temp 98.1°F | Ht 67.25 in | Wt 176.0 lb

## 2023-04-06 DIAGNOSIS — M5442 Lumbago with sciatica, left side: Secondary | ICD-10-CM | POA: Insufficient documentation

## 2023-04-06 DIAGNOSIS — Z23 Encounter for immunization: Secondary | ICD-10-CM

## 2023-04-06 MED ORDER — PREDNISONE 20 MG PO TABS: ORAL_TABLET | ORAL | 0 refills | Status: DC

## 2023-04-06 MED ORDER — TIZANIDINE HCL 4 MG PO TABS: 2.0000 mg | ORAL_TABLET | Freq: Three times a day (TID) | ORAL | 0 refills | Status: DC | PRN

## 2023-04-06 NOTE — Assessment & Plan Note (Signed)
Story/exam suspicious for L radiculopathy vs L piriformis syndrome causing L sciatica. No red flags.  Treat with prednisone taper, tizanidine muscle relaxant. May start OTC NSAID after prednisone course if needed.  Provided with exercises from [facility] pt advisor on piriformis syndrome. He is planning to see [facility] next week.  Update if not improving with above, if ongoing past 4-6 weeks, consider lumbar imaging/further evaluation. Pt agrees with plan.

## 2023-04-06 NOTE — Progress Notes (Signed)
Ph: [phone] Fax: [phone]   Patient ID: Peter Sanford, male    DOB: 23-Nov-1965, 57 y.o.   MRN: [id number]  This visit was conducted in person.  BP 127/68 (BP Location: Left Arm, Patient Position: Sitting, Cuff Size: Normal)   Pulse (!) 50   Temp 98.1 F (36.7 C) (Oral)   Ht 5' 7.25" (1.708 m)   Wt 176 lb (79.8 kg)   SpO2 99%   BMI 27.36 kg/m    CC: discuss sciatica Subjective:   HPI: Peter Sanford is a 57 y.o. male presenting on 04/06/2023 for Sciatica   1 wk h/o lower back pain with radiation down L lateral thigh, calf into dorsal foot. + numbness and tingling. Leg feels weak from pain. Worse pain when he first gets up to standing in am. Sitting reduces pain.   Denies inciting trauma/injury.  No fevers/chills, saddle anesthesia, bowel/bladder accidents.   H/o SI joint dysfunction s/p adjustments.   Has seen chiropractor s/p dry needling Has tried tramadol with benefit, tylenol and ibuprofen. Muscle relaxant tizanidine 2mg  also helped.   Has upcoming appt with [facility] next week.      Relevant past medical, surgical, family and social history reviewed and updated as indicated. Interim medical history since our last visit reviewed. Allergies and medications reviewed and updated. Outpatient Medications Prior to Visit  Medication Sig Dispense Refill   citalopram (CELEXA) 20 MG tablet TAKE 1 TABLET BY MOUTH EVERY DAY 90 tablet 1   ELDERBERRY PO Take by mouth.     Loratadine 10 MG CAPS Take 1 capsule by mouth daily as needed.  seasonal     Multiple Vitamin (MULTIVITAMIN) tablet Take 1 tablet by mouth daily.     Omega-3 Fatty Acids (OMEGA 3 PO) Take by mouth daily.     No facility-administered medications prior to visit.     Per HPI unless specifically indicated in ROS section below Review of Systems  Objective:  BP 127/68 (BP Location: Left Arm, Patient Position: Sitting, Cuff Size: Normal)   Pulse (!) 50   Temp 98.1 F (36.7 C) (Oral)    Ht 5' 7.25" (1.708 m)   Wt 176 lb (79.8 kg)   SpO2 99%   BMI 27.36 kg/m   Wt Readings from Last 3 Encounters:  04/06/23 176 lb (79.8 kg)  09/13/22 179 lb 8 oz (81.4 kg)  06/03/22 176 lb (79.8 kg)      Physical Exam Vitals and nursing note reviewed.  Constitutional:      Appearance: Normal appearance. He is not ill-appearing.  Musculoskeletal:        General: Normal range of motion.     Right lower leg: No edema.     Left lower leg: No edema.     Comments:  No pain midline spine Discomfort to L lower paraspinous mm  + SLR on left. No pain with int/ext rotation at hip. Neg FABER. Discomfort at L SIJ and L sciatic notch. No pain at GTB  bilaterally.   Skin:    General: Skin is warm and dry.  Neurological:     Mental Status: He is alert.     Sensory: Sensation is intact.     Motor: Motor function is intact.     Coordination: Coordination is intact.     Deep Tendon Reflexes:     Reflex Scores:      Patellar reflexes are 2+ on the right side and 2+ on the left side.  Achilles reflexes are 2+ on the right side and 2+ on the left side.    Comments:  5/5 strength BLE Sensation intact BLE       Lab Results  Component Value Date   NA 138 09/08/2022   CL 103 09/08/2022   K 4.6 09/08/2022   CO2 27 09/08/2022   BUN 31 (H) 09/08/2022   CREATININE 1.19 09/08/2022   GFR 68.45 09/08/2022   CALCIUM 9.2 09/08/2022   ALBUMIN 4.0 09/08/2022   GLUCOSE 126 (H) 09/08/2022    Assessment & Plan:   Problem List Items Addressed This Visit     Left-sided low back pain with left-sided sciatica    Story/exam suspicious for L radiculopathy vs L piriformis syndrome causing L sciatica. No red flags.  Treat with prednisone taper, tizanidine muscle relaxant. May start OTC NSAID after prednisone course if needed.  Provided with exercises from [facility] pt advisor on piriformis syndrome. He is planning to see [facility] next week.  Update if not improving with above, if ongoing past 4-6 weeks,  consider lumbar imaging/further evaluation. Pt agrees with plan.       Relevant Medications   predniSONE (DELTASONE) 20 MG tablet   tiZANidine (ZANAFLEX) 4 MG tablet   Other Visit Diagnoses     Need for influenza vaccination    -  Primary   Relevant Orders   Flu vaccine trivalent PF, 6mos and older(Flulaval,Afluria,Fluarix,Fluzone) (Completed)        Meds ordered this encounter  Medications   predniSONE (DELTASONE) 20 MG tablet    Sig: Take two tablets daily for 4 days followed by one tablet daily for 4 days    Dispense:  12 tablet    Refill:  0   tiZANidine (ZANAFLEX) 4 MG tablet    Sig: Take 0.5-1 tablets (2-4 mg total) by mouth 3 (three) times daily as needed for muscle spasms (sedation precautions).    Dispense:  30 tablet    Refill:  0    Orders Placed This Encounter  Procedures   Flu vaccine trivalent PF, 6mos and older(Flulaval,Afluria,Fluarix,Fluzone)    Patient Instructions  Flu shot today  I think you do have sciatica on left.  Treat with prednisone taper sent to pharmacy. May also use tizanidine muscle relaxant. Once done with prednisone, may take OTC ibuprofen 600mg  2-3 times daily with meals as needed. Do exercises provided today. Agree with [facility] course. Let us know if not improving with above.   Follow up plan: Return if symptoms worsen or fail to improve.  Eustaquio Boyden, MD

## 2023-04-06 NOTE — Patient Instructions (Addendum)
Flu shot today  I think you do have sciatica on left.  Treat with prednisone taper sent to pharmacy. May also use tizanidine muscle relaxant. Once done with prednisone, may take OTC ibuprofen 600mg  2-3 times daily with meals as needed. Do exercises provided today. Agree with [facility] course. Let us know if not improving with above.

## 2023-05-20 ENCOUNTER — Other Ambulatory Visit: Payer: Self-pay | Admitting: Family Medicine

## 2023-08-17 ENCOUNTER — Other Ambulatory Visit: Payer: Self-pay | Admitting: Family Medicine

## 2023-09-06 ENCOUNTER — Telehealth: Payer: Self-pay | Admitting: *Deleted

## 2023-09-06 DIAGNOSIS — Z125 Encounter for screening for malignant neoplasm of prostate: Secondary | ICD-10-CM

## 2023-09-06 DIAGNOSIS — Z13220 Encounter for screening for lipoid disorders: Secondary | ICD-10-CM

## 2023-09-06 DIAGNOSIS — R7303 Prediabetes: Secondary | ICD-10-CM

## 2023-09-06 DIAGNOSIS — Z79899 Other long term (current) drug therapy: Secondary | ICD-10-CM

## 2023-09-06 NOTE — Telephone Encounter (Signed)
-----   Message from Eustaquio Boyden sent at 09/04/2023  2:08 PM EDT ----- Regarding: FW: Labs for Wednesday 3.12.25 Forward to PCP for lab order ----- Message ----- From: Lovena Neighbours, RT Sent: 08/22/2023   2:05 PM EDT To: Eustaquio Boyden, MD Subject: Labs for Wednesday 3.12.25                     Please put physical lab orders in future. Thank you, Denny Peon

## 2023-09-07 ENCOUNTER — Other Ambulatory Visit (INDEPENDENT_AMBULATORY_CARE_PROVIDER_SITE_OTHER): Payer: BC Managed Care – PPO

## 2023-09-07 DIAGNOSIS — Z79899 Other long term (current) drug therapy: Secondary | ICD-10-CM | POA: Diagnosis not present

## 2023-09-07 DIAGNOSIS — Z13220 Encounter for screening for lipoid disorders: Secondary | ICD-10-CM

## 2023-09-07 DIAGNOSIS — R7303 Prediabetes: Secondary | ICD-10-CM

## 2023-09-07 DIAGNOSIS — Z125 Encounter for screening for malignant neoplasm of prostate: Secondary | ICD-10-CM

## 2023-09-07 LAB — HEPATIC FUNCTION PANEL
ALT: 18 U/L (ref 0–53)
AST: 15 U/L (ref 0–37)
Albumin: 4.2 g/dL (ref 3.5–5.2)
Alkaline Phosphatase: 48 U/L (ref 39–117)
Bilirubin, Direct: 0.1 mg/dL (ref 0.0–0.3)
Total Bilirubin: 0.6 mg/dL (ref 0.2–1.2)
Total Protein: 6.7 g/dL (ref 6.0–8.3)

## 2023-09-07 LAB — CBC WITH DIFFERENTIAL/PLATELET
Basophils Absolute: 0 10*3/uL (ref 0.0–0.1)
Basophils Relative: 1 % (ref 0.0–3.0)
Eosinophils Absolute: 0.4 10*3/uL (ref 0.0–0.7)
Eosinophils Relative: 8.6 % — ABNORMAL HIGH (ref 0.0–5.0)
HCT: 46 % (ref 39.0–52.0)
Hemoglobin: 15.5 g/dL (ref 13.0–17.0)
Lymphocytes Relative: 37.2 % (ref 12.0–46.0)
Lymphs Abs: 1.6 10*3/uL (ref 0.7–4.0)
MCHC: 33.6 g/dL (ref 30.0–36.0)
MCV: 95 fl (ref 78.0–100.0)
Monocytes Absolute: 0.3 10*3/uL (ref 0.1–1.0)
Monocytes Relative: 7.5 % (ref 3.0–12.0)
Neutro Abs: 1.9 10*3/uL (ref 1.4–7.7)
Neutrophils Relative %: 45.7 % (ref 43.0–77.0)
Platelets: 245 10*3/uL (ref 150.0–400.0)
RBC: 4.85 Mil/uL (ref 4.22–5.81)
RDW: 12.7 % (ref 11.5–15.5)
WBC: 4.2 10*3/uL (ref 4.0–10.5)

## 2023-09-07 LAB — LIPID PANEL
Cholesterol: 215 mg/dL — ABNORMAL HIGH (ref 0–200)
HDL: 50.4 mg/dL (ref >39.00)
LDL Cholesterol: 147 mg/dL — ABNORMAL HIGH (ref 0–99)
NonHDL: 164.67
Total CHOL/HDL Ratio: 4
Triglycerides: 86 mg/dL (ref 0.0–149.0)
VLDL: 17.2 mg/dL (ref 0.0–40.0)

## 2023-09-07 LAB — BASIC METABOLIC PANEL
BUN: 20 mg/dL (ref 6–23)
CO2: 29 meq/L (ref 19–32)
Calcium: 9.1 mg/dL (ref 8.4–10.5)
Chloride: 102 meq/L (ref 96–112)
Creatinine, Ser: 1.06 mg/dL (ref 0.40–1.50)
GFR: 78.1 mL/min (ref >60.00)
Glucose, Bld: 158 mg/dL — ABNORMAL HIGH (ref 70–99)
Potassium: 4.6 meq/L (ref 3.5–5.1)
Sodium: 138 meq/L (ref 135–145)

## 2023-09-07 LAB — HEMOGLOBIN A1C: Hgb A1c MFr Bld: 6.4 % (ref 4.6–6.5)

## 2023-09-09 LAB — PSA, TOTAL WITH REFLEX TO PSA, FREE: PSA, Total: 0.8 ng/mL (ref <=4.0)

## 2023-09-13 NOTE — Progress Notes (Unsigned)
 Kahmari Koller T. Raileigh Sabater, MD, CAQ Sports Medicine [facility] at [facility] [address] [place], [address]  Phone: [phone]  FAX: [phone]  Peter Sanford - 58 y.o. male  MRN [id number]  Date of Birth: 1965/11/23  Date: 09/14/2023  PCP: Hannah Beat, MD  Referral: Hannah Beat, MD  No chief complaint on file.  Patient Care Team: Hannah Beat, MD as PCP - General (Family Medicine) Subjective:   Peter Sanford is a 58 y.o. pleasant patient who presents with the following:  Preventative Health Maintenance Visit:  Health Maintenance Summary Reviewed and updated, unless pt declines services.  Tobacco History Reviewed. Alcohol: No concerns, no excessive use Exercise Habits: Some activity, rec at least 30 mins 5 times a week STD concerns: no risk or activity to increase risk Drug Use: None  Reason is a generally healthy gentleman who presents today for general health maintenance exam.  He continues to be prediabetic, he did have an elevated fasting blood sugar, and his A1c is now 6.4.  He has had prediabetes for an extended period of time.  Shingrix Tetanus COVID booster  Health Maintenance  Topic Date Due   Zoster Vaccines- Shingrix (1 of 2) Never done   DTaP/Tdap/Td (2 - Td or Tdap) 06/12/2023   COVID-19 Vaccine (5 - 2024-25 season) 04/21/2024 (Originally 02/27/2023)   Colonoscopy  06/04/2027   INFLUENZA VACCINE  Completed   Hepatitis C Screening  Completed   HIV Screening  Completed   HPV VACCINES  Aged Out   Immunization History  Administered Date(s) Administered   Influenza Inj Mdck Quad Pf 04/18/2022   Influenza Nasal 05/14/2015   Influenza Split 03/16/2012   Influenza, Seasonal, Injecte, Preservative Fre 04/01/2019, 04/06/2023   Influenza,inj,Quad PF,6+ Mos 04/15/2017, 03/24/2018, 04/13/2020   Moderna Sars-Covid-2 Vaccination 05/04/2020   PFIZER(Purple Top)SARS-COV-2 Vaccination 08/31/2019, 09/21/2019    Pfizer Covid-19 Vaccine Bivalent Booster 63yrs & up 06/14/2021   Tdap 06/11/2013   Patient Active Problem List   Diagnosis Date Noted   Prediabetes 02/11/2016    Priority: Medium    Generalized anxiety disorder 02/02/2011    Priority: Medium    Allergic rhinitis 11/22/2006    Priority: Low   Left-sided low back pain with left-sided sciatica 04/06/2023   Polyp of ascending colon 06/03/2022   Benign neoplasm of rectosigmoid junction    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   NEPHROLITHIASIS, HX OF 11/22/2006    Past Medical History:  Diagnosis Date   Arthritis    knees   GAD (generalized anxiety disorder)    History of nephrolithiasis    Pollen allergies    Wears contact lenses     Past Surgical History:  Procedure Laterality Date   COLONOSCOPY WITH PROPOFOL N/A 06/03/2017   Procedure: COLONOSCOPY WITH PROPOFOL;  Surgeon: Midge Minium, MD;  Location: [facility];  Service: Endoscopy;  Laterality: N/A;   COLONOSCOPY WITH PROPOFOL N/A 06/03/2022   Procedure: COLONOSCOPY WITH PROPOFOL;  Surgeon: Midge Minium, MD;  Location: [facility];  Service: Endoscopy;  Laterality: N/A;   POLYPECTOMY  06/03/2017   Procedure: POLYPECTOMY;  Surgeon: Midge Minium, MD;  Location: [facility];  Service: Endoscopy;;   POLYPECTOMY  06/03/2022   Procedure: POLYPECTOMY;  Surgeon: Midge Minium, MD;  Location: [facility];  Service: Endoscopy;;   WISDOM TOOTH EXTRACTION     in 20's    Family History  Problem Relation Age of Onset   Cancer Father     Social History   Social  History Narrative   Not on file    Past Medical History, Surgical History, Social History, Family History, Problem List, Medications, and Allergies have been reviewed and updated if relevant.  Review of Systems: Pertinent positives are listed above.  Otherwise, a full 14 point review of systems has been done in full and it is negative except where it is noted positive.  Objective:   There were no  vitals taken for this visit. Ideal Body Weight:    Ideal Body Weight:   No results found.    04/06/2023   10:15 AM 09/13/2022    2:07 PM 09/09/2021    2:38 PM 09/03/2020    2:06 PM 07/04/2019    2:04 PM  Depression screen PHQ 2/9  Decreased Interest 0 0 0 0 0  Down, Depressed, Hopeless 0 0 0 0 0  PHQ - 2 Score 0 0 0 0 0  Altered sleeping 3      Tired, decreased energy 0      Change in appetite 0      Feeling bad or failure about yourself  0      Trouble concentrating 0      Moving slowly or fidgety/restless 0      Suicidal thoughts 0      PHQ-9 Score 3      Difficult doing work/chores Not difficult at all         GEN: well developed, well nourished, no acute distress Eyes: conjunctiva and lids normal, PERRLA, EOMI ENT: TM clear, nares clear, oral exam WNL Neck: supple, no lymphadenopathy, no thyromegaly, no JVD Pulm: clear to auscultation and percussion, respiratory effort normal CV: regular rate and rhythm, S1-S2, no murmur, rub or gallop, no bruits, peripheral pulses normal and symmetric, no cyanosis, clubbing, edema or varicosities GI: soft, non-tender; no hepatosplenomegaly, masses; active bowel sounds all quadrants GU: deferred Lymph: no cervical, axillary or inguinal adenopathy MSK: gait normal, muscle tone and strength WNL, no joint swelling, effusions, discoloration, crepitus  SKIN: clear, good turgor, color WNL, no rashes, lesions, or ulcerations Neuro: normal mental status, normal strength, sensation, and motion Psych: alert; oriented to person, place and time, normally interactive and not anxious or depressed in appearance.  All labs reviewed with patient. Results for orders placed or performed in visit on 09/07/23  PSA, Total with Reflex to PSA, Free   Collection Time: 09/07/23  9:03 AM  Result Value Ref Range   PSA, Total 0.8 < OR = 4.0 ng/mL  Hepatic Function Panel   Collection Time: 09/07/23  9:03 AM  Result Value Ref Range   Total Bilirubin 0.6 0.2 - 1.2  mg/dL   Bilirubin, Direct 0.1 0.0 - 0.3 mg/dL   Alkaline Phosphatase 48 39 - 117 U/L   AST 15 0 - 37 U/L   ALT 18 0 - 53 U/L   Total Protein 6.7 6.0 - 8.3 g/dL   Albumin 4.2 3.5 - 5.2 g/dL  Basic metabolic panel   Collection Time: 09/07/23  9:03 AM  Result Value Ref Range   Sodium 138 135 - 145 mEq/L   Potassium 4.6 3.5 - 5.1 mEq/L   Chloride 102 96 - 112 mEq/L   CO2 29 19 - 32 mEq/L   Glucose, Bld 158 (H) 70 - 99 mg/dL   BUN 20 6 - 23 mg/dL   Creatinine, Ser 9.81 0.40 - 1.50 mg/dL   GFR 19.14 >78.29 mL/min   Calcium 9.1 8.4 - 10.5 mg/dL  CBC with Differential/Platelet  Collection Time: 09/07/23  9:03 AM  Result Value Ref Range   WBC 4.2 4.0 - 10.5 K/uL   RBC 4.85 4.22 - 5.81 Mil/uL   Hemoglobin 15.5 13.0 - 17.0 g/dL   HCT 66.4 40.3 - 47.4 %   MCV 95.0 78.0 - 100.0 fl   MCHC 33.6 30.0 - 36.0 g/dL   RDW 25.9 56.3 - 87.5 %   Platelets 245.0 150.0 - 400.0 K/uL   Neutrophils Relative % 45.7 43.0 - 77.0 %   Lymphocytes Relative 37.2 12.0 - 46.0 %   Monocytes Relative 7.5 3.0 - 12.0 %   Eosinophils Relative 8.6 (H) 0.0 - 5.0 %   Basophils Relative 1.0 0.0 - 3.0 %   Neutro Abs 1.9 1.4 - 7.7 K/uL   Lymphs Abs 1.6 0.7 - 4.0 K/uL   Monocytes Absolute 0.3 0.1 - 1.0 K/uL   Eosinophils Absolute 0.4 0.0 - 0.7 K/uL   Basophils Absolute 0.0 0.0 - 0.1 K/uL  Hemoglobin A1c   Collection Time: 09/07/23  9:03 AM  Result Value Ref Range   Hgb A1c MFr Bld 6.4 4.6 - 6.5 %  Lipid panel   Collection Time: 09/07/23  9:03 AM  Result Value Ref Range   Cholesterol 215 (H) 0 - 200 mg/dL   Triglycerides 64.3 0.0 - 149.0 mg/dL   HDL 32.95 >18.84 mg/dL   VLDL 16.6 0.0 - 06.3 mg/dL   LDL Cholesterol 016 (H) 0 - 99 mg/dL   Total CHOL/HDL Ratio 4    NonHDL 164.67     Assessment and Plan:     ICD-10-CM   1. Healthcare maintenance  Z00.00       Health Maintenance Exam: The patient's preventative maintenance and recommended screening tests for an annual wellness exam were reviewed in full  today. Brought up to date unless services declined.  Counselled on the importance of diet, exercise, and its role in overall health and mortality. The patient's FH and SH was reviewed, including their home life, tobacco status, and drug and alcohol status.  Follow-up in 1 year for physical exam or additional follow-up below.  Disposition: No follow-ups on file.  No orders of the defined types were placed in this encounter.  There are no discontinued medications. No orders of the defined types were placed in this encounter.   Signed,  Elpidio Galea. Marthe Dant, MD   Allergies as of 09/14/2023   No Known Allergies      Medication List        Accurate as of September 13, 2023  1:28 PM. If you have any questions, ask your nurse or doctor.          citalopram 20 MG tablet Commonly known as: CELEXA TAKE 1 TABLET BY MOUTH EVERY DAY   ELDERBERRY PO Take by mouth.   Loratadine 10 MG Caps Take 1 capsule by mouth daily as needed.  seasonal   multivitamin tablet Take 1 tablet by mouth daily.   OMEGA 3 PO Take by mouth daily.   predniSONE 20 MG tablet Commonly known as: DELTASONE Take two tablets daily for 4 days followed by one tablet daily for 4 days   tiZANidine 4 MG tablet Commonly known as: Zanaflex Take 0.5-1 tablets (2-4 mg total) by mouth 3 (three) times daily as needed for muscle spasms (sedation precautions).

## 2023-09-14 ENCOUNTER — Ambulatory Visit (INDEPENDENT_AMBULATORY_CARE_PROVIDER_SITE_OTHER): Payer: BC Managed Care – PPO | Admitting: Family Medicine

## 2023-09-14 ENCOUNTER — Encounter: Payer: Self-pay | Admitting: Family Medicine

## 2023-09-14 VITALS — BP 128/78 | HR 65 | Temp 98.3°F | Ht 67.0 in | Wt 181.0 lb

## 2023-09-14 DIAGNOSIS — Z23 Encounter for immunization: Secondary | ICD-10-CM

## 2023-09-14 DIAGNOSIS — Z0000 Encounter for general adult medical examination without abnormal findings: Secondary | ICD-10-CM

## 2023-09-14 NOTE — Addendum Note (Signed)
 Addended by: Damita Lack on: 09/14/2023 02:37 PM   Modules accepted: Orders

## 2023-11-14 ENCOUNTER — Other Ambulatory Visit: Payer: Self-pay | Admitting: Family Medicine

## 2023-11-14 NOTE — Addendum Note (Signed)
 Addended by: Heather Mckendree M on: 11/14/2023 02:23 PM   Modules accepted: Orders

## 2023-11-14 NOTE — Telephone Encounter (Unsigned)
 Copied from CRM [id number]. Topic: Clinical - Medication Question >> Nov 14, 2023  2:10 PM [name] wrote: Reason for CRM: Patient dropped some pills down the sink as he was opening the bottle, wanting to know if another refill can be sent in for citalopram  (CELEXA ) 20 MG tablet

## 2023-11-22 ENCOUNTER — Ambulatory Visit: Payer: Self-pay

## 2023-11-22 NOTE — Progress Notes (Unsigned)
     Peter Sanford T. Joeanna Howdyshell, MD, CAQ Sports Medicine [facility] at [facility] [address] [place], [address]  Phone: [phone]  FAX: [phone]  Peter Sanford - 58 y.o. male  MRN [id number]  Date of Birth: 1965/10/28  Date: 11/24/2023  PCP: Scherrie Curt, MD  Referral: Scherrie Curt, MD  No chief complaint on file.  Subjective:   Peter Sanford is a 58 y.o. very pleasant male patient with There is no height or weight on file to calculate BMI. who presents with the following:  Peter Sanford is normally very healthy guy, he presents with some lower abdominal pain.    Review of Systems is noted in the HPI, as appropriate  Objective:   There were no vitals taken for this visit.  GEN: No acute distress; alert,appropriate. PULM: Breathing comfortably in no respiratory distress PSYCH: Normally interactive.   Laboratory and Imaging Data:  Assessment and Plan:   ***

## 2023-11-22 NOTE — Telephone Encounter (Signed)
 Copied from CRM [id number]. Topic: Clinical - Red Word Triage >> Nov 22, 2023 10:39 AM Clydene Darner H wrote: Red Word that prompted transfer to Nurse Triage: Patient called reporting lower abdominal pain that has been ongoing for approximately 3 to 4 weeks.   Chief Complaint: Abdominal pain Symptoms: Lower abdominal pain, testicle pain  Frequency: Constant   Pertinent Negatives: Patient denies any other symptoms at this time  Disposition: [] ED /[] Urgent Care (no appt availability in office) / [x] Appointment(In office/virtual)/ []  [facility]/ [] Home Care/ [] Refused Recommended Disposition /[] [facility]/ []  Follow-up with PCP Additional Notes: Patient reports he has had left lower abdominal pain for the last 3-4 weeks. He states his pain is mild and has been constant. He states his pain seems to radiate to his testicles as well, which he states is improved with Ibuprofen. He denies any other symptoms at this time. Patient has an appointment for Thursday for evaluation of his symptoms. Patient instructed to call back for new or worsening symptoms. Patient verbalized understanding and agreement with this plan.     Reason for Disposition  Abdominal pain is a chronic symptom (recurrent or ongoing AND present > 4 weeks)    Ongoing for 3-4 weeks  Answer Assessment - Initial Assessment Questions 1. LOCATION: "Where does it hurt?"      Left lower abdomen  2. RADIATION: "Does the pain shoot anywhere else?" (e.g., chest, back)     Radiates to left testicle  3. ONSET: "When did the pain begin?" (Minutes, hours or days ago)      3-4 weeks 4. SUDDEN: "Gradual or sudden onset?"     Sudden 5. PATTERN "Does the pain come and go, or is it constant?"    - If it comes and goes: "How long does it last?" "Do you have pain now?"     (Note: Comes and goes means the pain is intermittent. It goes away completely between bouts.)    - If constant: "Is it getting better, staying the same, or getting  worse?"      (Note: Constant means the pain never goes away completely; most serious pain is constant and gets worse.)      Constant  6. SEVERITY: "How bad is the pain?"  (e.g., Scale 1-10; mild, moderate, or severe)    - MILD (1-3): Doesn't interfere with normal activities, abdomen soft and not tender to touch.     - MODERATE (4-7): Interferes with normal activities or awakens from sleep, abdomen tender to touch.     - SEVERE (8-10): Excruciating pain, doubled over, unable to do any normal activities.       Mild  7. RECURRENT SYMPTOM: "Have you ever had this type of stomach pain before?" If Yes, ask: "When was the last time?" and "What happened that time?"      No 8. CAUSE: "What do you think is causing the stomach pain?"     Unsure  9. RELIEVING/AGGRAVATING FACTORS: "What makes it better or worse?" (e.g., antacids, bending or twisting motion, bowel movement)     Taking Ibuprofen helps with testicle pain 10. OTHER SYMPTOMS: "Do you have any other symptoms?" (e.g., back pain, diarrhea, fever, urination pain, vomiting)       Testicle pain  Protocols used: Abdominal Pain - Male-A-AH

## 2023-11-24 ENCOUNTER — Ambulatory Visit: Admitting: Family Medicine

## 2023-11-24 ENCOUNTER — Encounter: Payer: Self-pay | Admitting: Family Medicine

## 2023-11-24 VITALS — BP 100/70 | HR 50 | Temp 98.4°F | Ht 67.0 in | Wt 181.1 lb

## 2023-11-24 DIAGNOSIS — N50812 Left testicular pain: Secondary | ICD-10-CM

## 2023-11-24 DIAGNOSIS — R1032 Left lower quadrant pain: Secondary | ICD-10-CM

## 2023-11-24 LAB — POC URINALSYSI DIPSTICK (AUTOMATED)
Bilirubin, UA: NEGATIVE
Blood, UA: NEGATIVE
Glucose, UA: NEGATIVE
Ketones, UA: NEGATIVE
Leukocytes, UA: NEGATIVE
Nitrite, UA: NEGATIVE
Protein, UA: NEGATIVE
Spec Grav, UA: 1.01 (ref 1.010–1.025)
Urobilinogen, UA: 0.2 U/dL
pH, UA: 8 (ref 5.0–8.0)

## 2023-11-24 MED ORDER — LEVOFLOXACIN 500 MG PO TABS: 500.0000 mg | ORAL_TABLET | Freq: Every day | ORAL | 0 refills | Status: AC

## 2023-11-24 MED ORDER — METRONIDAZOLE 500 MG PO TABS: 500.0000 mg | ORAL_TABLET | Freq: Three times a day (TID) | ORAL | 0 refills | Status: DC

## 2023-11-29 ENCOUNTER — Encounter: Admitting: Urology

## 2024-03-27 ENCOUNTER — Telehealth: Admitting: Physician Assistant

## 2024-03-27 DIAGNOSIS — J208 Acute bronchitis due to other specified organisms: Secondary | ICD-10-CM

## 2024-03-27 MED ORDER — PROMETHAZINE-DM 6.25-15 MG/5ML PO SYRP: 5.0000 mL | ORAL_SOLUTION | Freq: Four times a day (QID) | ORAL | 0 refills | Status: AC | PRN

## 2024-03-27 MED ORDER — FLUTICASONE PROPIONATE 50 MCG/ACT NA SUSP: 2.0000 | Freq: Every day | NASAL | 0 refills | Status: AC

## 2024-03-27 NOTE — Progress Notes (Signed)
 Virtual Visit Consent   Filbert A Denbleyker, you are scheduled for a virtual visit with a [facility] provider today. Just as with appointments in the office, your consent must be obtained to participate. Your consent will be active for this visit and any virtual visit you may have with one of our providers in the next 365 days. If you have a MyChart account, a copy of this consent can be sent to you electronically.  As this is a virtual visit, video technology does not allow for your provider to perform a traditional examination. This may limit your provider's ability to fully assess your condition. If your provider identifies any concerns that need to be evaluated in person or the need to arrange testing (such as labs, EKG, etc.), we will make arrangements to do so. Although advances in technology are sophisticated, we cannot ensure that it will always work on either your end or our end. If the connection with a video visit is poor, the visit may have to be switched to a telephone visit. With either a video or telephone visit, we are not always able to ensure that we have a secure connection.  By engaging in this virtual visit, you consent to the provision of healthcare and authorize for your insurance to be billed (if applicable) for the services provided during this visit. Depending on your insurance coverage, you may receive a charge related to this service.  I need to obtain your verbal consent now. Are you willing to proceed with your visit today? Peter Sanford has provided verbal consent on 03/27/2024 for a virtual visit (video or telephone). Peter Sanford, [place]  Date: 03/27/2024 9:20 AM   Virtual Visit via Video Note   I, Peter Sanford, connected with  Peter Sanford  ([id number], Dec 25, 1965) on 03/27/24 at  9:15 AM EDT by a video-enabled telemedicine application and verified that I am speaking with the correct person using two identifiers.  Location: Patient:  Virtual Visit Location Patient: Home Provider: Virtual Visit Location Provider: Home Office   I discussed the limitations of evaluation and management by telemedicine and the availability of in person appointments. The patient expressed understanding and agreed to proceed.    History of Present Illness: Peter Sanford is a 58 y.o. who identifies as a male who was assigned male at birth, and is being seen today for some lingering URI symptoms. 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 Notes most symptoms resolving with just a little lingering PND and nasal congestion, but the cough is still persistent at night time. Mainly dry, only being productive of scant phlegm first thing on waking. Denies fever, chills, chest pain or SOB.  OTC -- Sudafed, Mucinex  HPI: HPI  Problems:  Patient Active Problem List   Diagnosis Date Noted   Left-sided low back pain with left-sided sciatica 04/06/2023   Prediabetes 02/11/2016   Generalized anxiety disorder 02/02/2011   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   Allergic rhinitis 11/22/2006   NEPHROLITHIASIS, HX OF 11/22/2006    Allergies: No Known Allergies Medications:  Current Outpatient Medications:    fluticasone (FLONASE) 50 MCG/ACT nasal spray, Place 2 sprays into both nostrils daily., Disp: 16 g, Rfl: 0   promethazine-dextromethorphan (PROMETHAZINE-DM) 6.25-15 MG/5ML syrup, Take 5 mLs by mouth 4 (four) times daily as needed for cough., Disp: 118 mL, Rfl: 0   citalopram  (CELEXA ) 20 MG tablet,  TAKE 1 TABLET BY MOUTH EVERY DAY, Disp: 90 tablet, Rfl: 1   ELDERBERRY PO, Take by mouth., Disp: , Rfl:    Loratadine 10 MG CAPS, Take 1 capsule by mouth daily as needed.  seasonal, Disp: , Rfl:    Multiple Vitamin (MULTIVITAMIN) tablet, Take 1 tablet by mouth daily., Disp: , Rfl:    Omega-3 Fatty Acids (OMEGA 3 PO), Take by mouth daily., Disp: , Rfl:    Observations/Objective: Patient is well-developed, well-nourished in no acute distress.  Resting comfortably  at home.  Head is normocephalic, atraumatic.  No labored breathing.  Speech is clear and coherent with logical content.  Patient is alert and oriented at baseline.   Assessment and Plan: 1. Viral bronchitis (Primary) - fluticasone (FLONASE) 50 MCG/ACT nasal spray; Place 2 sprays into both nostrils daily.  Dispense: 16 g; Refill: 0 - promethazine-dextromethorphan (PROMETHAZINE-DM) 6.25-15 MG/5ML syrup; Take 5 mLs by mouth 4 (four) times daily as needed for cough.  Dispense: 118 mL; Refill: 0  Supportive measures and OTC medications reviewed. Add on Flonase once daily. Promethazine-DM per orders. Follow-up if symptoms are not continuing to improve/resolve or for any new/worsening symptoms despite treatment.  Follow Up Instructions: I discussed the assessment and treatment plan with the patient. The patient was provided an opportunity to ask questions and all were answered. The patient agreed with the plan and demonstrated an understanding of the instructions.  A copy of instructions were sent to the patient via MyChart unless otherwise noted below.    The patient was advised to call back or seek an in-person evaluation if the symptoms worsen or if the condition fails to improve as anticipated.    Peter Velma Lunger, PA-C

## 2024-03-27 NOTE — Patient Instructions (Signed)
  Lonni DELENA Boatman, thank you for joining Elsie Velma Lunger, PA-C for today's virtual visit.  While this provider is not your primary care provider (PCP), if your PCP is located in our provider database this encounter information will be shared with them immediately following your visit.   A [facility] MyChart account gives you access to today's visit and all your visits, tests, and labs performed at [facility]  click here if you don't have a [facility] MyChart account or go to [email or web address]  Consent: (Patient) Kari A Aguila provided verbal consent for this virtual visit at the beginning of the encounter.  Current Medications:  Current Outpatient Medications:    fluticasone (FLONASE) 50 MCG/ACT nasal spray, Place 2 sprays into both nostrils daily., Disp: 16 g, Rfl: 0   promethazine-dextromethorphan (PROMETHAZINE-DM) 6.25-15 MG/5ML syrup, Take 5 mLs by mouth 4 (four) times daily as needed for cough., Disp: 118 mL, Rfl: 0   citalopram  (CELEXA ) 20 MG tablet, TAKE 1 TABLET BY MOUTH EVERY DAY, Disp: 90 tablet, Rfl: 1   ELDERBERRY PO, Take by mouth., Disp: , Rfl:    Loratadine 10 MG CAPS, Take 1 capsule by mouth daily as needed.  seasonal, Disp: , Rfl:    Multiple Vitamin (MULTIVITAMIN) tablet, Take 1 tablet by mouth daily., Disp: , Rfl:    Omega-3 Fatty Acids (OMEGA 3 PO), Take by mouth daily., Disp: , Rfl:    Medications ordered in this encounter:  Meds ordered this encounter  Medications   fluticasone (FLONASE) 50 MCG/ACT nasal spray    Sig: Place 2 sprays into both nostrils daily.    Dispense:  16 g    Refill:  0    Supervising Provider:   BLAISE ALEENE KIDD [[id number]]   promethazine-dextromethorphan (PROMETHAZINE-DM) 6.25-15 MG/5ML syrup    Sig: Take 5 mLs by mouth 4 (four) times daily as needed for cough.    Dispense:  118 mL    Refill:  0    Supervising Provider:   BLAISE ALEENE KIDD [[id number]]     *If you need refills on other medications  prior to your next appointment, please contact your pharmacy*  Follow-Up: Call back or seek an in-person evaluation if the symptoms worsen or if the condition fails to improve as anticipated.  [facility] [phone]  Other Instructions Please hydrate and rest. Start a saline nasal rinse. Continue your OTC medications. Start the Flonase spray once daily and use the prescription cough medication as directed, when needed for cough.  If you note any non-resolving, new, or worsening symptoms despite treatment, please seek an in-person evaluation ASAP.    If you have been instructed to have an in-person evaluation today at a local Urgent Care facility, please use the link below. It will take you to a list of all of our available [facility], including address, phone number and hours of operation. Please do not delay care.  [facility]  If you or a family member do not have a primary care provider, use the link below to schedule a visit and establish care. When you choose a [facility] primary care physician or advanced practice provider, you gain a long-term partner in health. Find a Primary Care Provider  Learn more about [facility]'s in-office and virtual care options: [facility] - Get Care Now

## 2024-05-12 ENCOUNTER — Other Ambulatory Visit: Payer: Self-pay | Admitting: Family Medicine
